# Patient Record
Sex: Female | Born: 1990 | Race: Black or African American | Hispanic: No | Marital: Single | Smoking: Current every day smoker
Health system: Southern US, Community
[De-identification: ages and names within clinical notes are randomized; demographics above are authoritative.]

## PROBLEM LIST (undated history)

## (undated) ENCOUNTER — Inpatient Hospital Stay (HOSPITAL_COMMUNITY): Payer: Self-pay

## (undated) DIAGNOSIS — R0600 Dyspnea, unspecified: Secondary | ICD-10-CM

## (undated) DIAGNOSIS — F419 Anxiety disorder, unspecified: Secondary | ICD-10-CM

## (undated) DIAGNOSIS — F32A Depression, unspecified: Secondary | ICD-10-CM

## (undated) DIAGNOSIS — Z789 Other specified health status: Secondary | ICD-10-CM

## (undated) HISTORY — DX: Depression, unspecified: F32.A

## (undated) HISTORY — DX: Dyspnea, unspecified: R06.00

## (undated) HISTORY — PX: NO PAST SURGERIES: SHX2092

## (undated) HISTORY — DX: Anxiety disorder, unspecified: F41.9

---

## 2017-02-06 ENCOUNTER — Encounter (HOSPITAL_COMMUNITY): Payer: Self-pay | Admitting: *Deleted

## 2017-02-06 ENCOUNTER — Ambulatory Visit (HOSPITAL_COMMUNITY)
Admission: EM | Admit: 2017-02-06 | Discharge: 2017-02-06 | Disposition: A | Discharge status: Home / Self Care | Payer: Medicaid Other | Attending: Family Medicine | Admitting: Family Medicine

## 2017-02-06 DIAGNOSIS — R109 Unspecified abdominal pain: Secondary | ICD-10-CM | POA: Diagnosis not present

## 2017-02-06 DIAGNOSIS — Z3A13 13 weeks gestation of pregnancy: Secondary | ICD-10-CM

## 2017-02-06 DIAGNOSIS — Z3201 Encounter for pregnancy test, result positive: Secondary | ICD-10-CM | POA: Diagnosis not present

## 2017-02-06 DIAGNOSIS — R112 Nausea with vomiting, unspecified: Secondary | ICD-10-CM

## 2017-02-06 LAB — POCT PREGNANCY, URINE: Preg Test, Ur: POSITIVE — AB

## 2017-02-06 LAB — POCT URINALYSIS DIP (DEVICE)
GLUCOSE, UA: NEGATIVE mg/dL
HGB URINE DIPSTICK: NEGATIVE
Ketones, ur: >=160 mg/dL — AB
NITRITE: NEGATIVE
PROTEIN: 30 mg/dL — AB
UROBILINOGEN UA: 0.2 mg/dL (ref 0.0–1.0)
pH: 6 (ref 5.0–8.0)

## 2017-02-06 MED ORDER — ONDANSETRON 4 MG PO TBDP
4.0000 mg | ORAL_TABLET | Freq: Once | ORAL | Status: AC
  Administered 2017-02-06: 4 mg via ORAL

## 2017-02-06 MED ORDER — ONDANSETRON 4 MG PO TBDP
ORAL_TABLET | ORAL | Status: AC
  Filled 2017-02-06: qty 1

## 2017-02-06 NOTE — ED Provider Notes (Signed)
CSN: [id number]     Arrival date & time 02/06/17  1332 History   None    Chief Complaint  Patient presents with  . Abdominal Pain  . Weakness   (Consider location/radiation/quality/duration/timing/severity/associated sxs/prior Treatment) 26 year old female presents to clinic with a 24-hour history of lower abdominal pain, nausea, vomiting, and chills. She has had no diarrhea, unsure about fever, no chest pain, or shortness of breath. She has no dysuria, however states she has not had a period since early February. She denies any vaginal discharge, or any spotting. She is sexually active, does not use birth control. States there is a possibility of pregnancy.   The history is provided by the patient.  Abdominal Pain  Associated symptoms: chills, nausea and vomiting   Associated symptoms: no constipation, no diarrhea, no dysuria, no vaginal bleeding and no vaginal discharge   Weakness  Primary symptoms include no dizziness. Associated symptoms include vomiting. Pertinent negatives include no headaches.    History reviewed. No pertinent past medical history. History reviewed. No pertinent surgical history. History reviewed. No pertinent family history. Social History  Substance Use Topics  . Smoking status: Never Smoker  . Smokeless tobacco: Never Used  . Alcohol use No   OB History    No data available     Review of Systems  Constitutional: Positive for chills.  HENT: Negative.   Respiratory: Negative.   Cardiovascular: Negative.   Gastrointestinal: Positive for abdominal pain, nausea and vomiting. Negative for constipation and diarrhea.  Genitourinary: Negative for dysuria, flank pain, pelvic pain, vaginal bleeding, vaginal discharge and vaginal pain.  Musculoskeletal: Negative.   Skin: Negative.   Neurological: Positive for weakness. Negative for dizziness and headaches.    Allergies  Patient has no known allergies.  Home Medications   Prior to Admission medications    Not on File   Meds Ordered and Administered this Visit   Medications  ondansetron (ZOFRAN-ODT) disintegrating tablet 4 mg (not administered)    BP (!) 115/52 (BP Location: Right Arm) Comment: rn notified  Pulse 81   Temp 98.4 F (36.9 C) (Oral)   Resp 14   LMP  (LMP Unknown)   SpO2 100%  No data found.   Physical Exam  Constitutional: She is oriented to person, place, and time. She appears well-developed and well-nourished. No distress.  HENT:  Head: Normocephalic and atraumatic.  Right Ear: External ear normal.  Left Ear: External ear normal.  Eyes: Conjunctivae are normal.  Neck: Normal range of motion.  Cardiovascular: Normal rate and regular rhythm.   Pulmonary/Chest: Effort normal and breath sounds normal.  Abdominal: Normal appearance and bowel sounds are normal. There is tenderness in the periumbilical area and suprapubic area. There is no rebound and no CVA tenderness.  Neurological: She is alert and oriented to person, place, and time.  Skin: Skin is warm and dry. Capillary refill takes less than 2 seconds. No rash noted. She is not diaphoretic. No erythema.  Psychiatric: She has a normal mood and affect. Her behavior is normal.  Nursing note and vitals reviewed.   Urgent Care Course     Procedures (including critical care time)  Labs Review Labs Reviewed  POCT URINALYSIS DIP (DEVICE) - Abnormal; Notable for the following:       Result Value   Bilirubin Urine SMALL (*)    Ketones, ur >=160 (*)    Protein, ur 30 (*)    Leukocytes, UA TRACE (*)    All other components within normal limits  POCT PREGNANCY, URINE - Abnormal; Notable for the following:    Preg Test, Ur POSITIVE (*)    All other components within normal limits    Imaging Review No results found.     MDM   1. [redacted] weeks gestation of pregnancy    Positive pregnancy test, given Zofran in clinic for nausea and vomiting, discharged with instructions to go to the [facility] for  further evaluation of abdominal pain.   Dorena Bodo, NP 02/06/17 1527

## 2017-02-06 NOTE — Discharge Instructions (Signed)
You have a positive pregnancy test. With your symptoms of abdominal pain, I strongly recommend he go to the women's emergency room at [facility] for further evaluation of your abdominal pain.

## 2017-02-06 NOTE — ED Triage Notes (Signed)
Had   Sweaty     Episode last  Night   Lower  Back  Pain      With    vomiting  Last  Pm     Symptoms  Worse  Around  4  Am     denys  Any  Vaginal  Bleeding   Weakness

## 2017-02-13 ENCOUNTER — Encounter (HOSPITAL_COMMUNITY): Payer: Self-pay | Admitting: *Deleted

## 2017-02-13 ENCOUNTER — Inpatient Hospital Stay (HOSPITAL_COMMUNITY)
Admission: AD | Admit: 2017-02-13 | Discharge: 2017-02-13 | Disposition: A | Discharge status: Home / Self Care | Payer: Medicaid Other | Source: Ambulatory Visit | Attending: Obstetrics and Gynecology | Admitting: Obstetrics and Gynecology

## 2017-02-13 ENCOUNTER — Inpatient Hospital Stay (HOSPITAL_COMMUNITY): Payer: Medicaid Other

## 2017-02-13 DIAGNOSIS — Z331 Pregnant state, incidental: Secondary | ICD-10-CM

## 2017-02-13 DIAGNOSIS — Z3490 Encounter for supervision of normal pregnancy, unspecified, unspecified trimester: Secondary | ICD-10-CM

## 2017-02-13 DIAGNOSIS — Z3A09 9 weeks gestation of pregnancy: Secondary | ICD-10-CM | POA: Diagnosis not present

## 2017-02-13 DIAGNOSIS — N939 Abnormal uterine and vaginal bleeding, unspecified: Secondary | ICD-10-CM | POA: Diagnosis present

## 2017-02-13 DIAGNOSIS — O209 Hemorrhage in early pregnancy, unspecified: Secondary | ICD-10-CM | POA: Insufficient documentation

## 2017-02-13 DIAGNOSIS — O208 Other hemorrhage in early pregnancy: Secondary | ICD-10-CM

## 2017-02-13 LAB — CBC
HEMATOCRIT: 38.7 % (ref 36.0–46.0)
HEMOGLOBIN: 13.2 g/dL (ref 12.0–15.0)
MCH: 29.3 pg (ref 26.0–34.0)
MCHC: 34.1 g/dL (ref 30.0–36.0)
MCV: 86 fL (ref 78.0–100.0)
Platelets: 222 10*3/uL (ref 150–400)
RBC: 4.5 MIL/uL (ref 3.87–5.11)
RDW: 14.7 % (ref 11.5–15.5)
WBC: 7.7 10*3/uL (ref 4.0–10.5)

## 2017-02-13 LAB — URINALYSIS, ROUTINE W REFLEX MICROSCOPIC
BACTERIA UA: NONE SEEN
Bilirubin Urine: NEGATIVE
GLUCOSE, UA: NEGATIVE mg/dL
KETONES UR: NEGATIVE mg/dL
LEUKOCYTES UA: NEGATIVE
Nitrite: NEGATIVE
PH: 7 (ref 5.0–8.0)
Protein, ur: NEGATIVE mg/dL
RBC / HPF: NONE SEEN RBC/hpf (ref 0–5)
Specific Gravity, Urine: 1.008 (ref 1.005–1.030)

## 2017-02-13 LAB — HCG, QUANTITATIVE, PREGNANCY: HCG, BETA CHAIN, QUANT, S: 89984 m[IU]/mL — AB (ref <5)

## 2017-02-13 MED ORDER — PREPLUS 27-1 MG PO TABS: 1.0000 | ORAL_TABLET | Freq: Every day | ORAL | 13 refills | Status: DC

## 2017-02-13 NOTE — MAU Note (Signed)
Pt presents to MAU with complaints of vaginal bleeding that started today while pt was at work . Lower abdominal pain. Was evaluated at urgent care a week ago and was told to come here for evaluation

## 2017-02-13 NOTE — MAU Provider Note (Signed)
  History     CSN: [id number]  Arrival date and time: 02/13/17 1755   First Provider Initiated Contact with Patient 02/13/17 1901      Chief Complaint  Patient presents with  . Vaginal Bleeding   HPI  Gina Montoya is a 26 yo G2P1001 with an estimated due date of  08/02/17 based on LMP who presents to the maternity admissions unit for complaints of abnormal vaginal bleeding and abdominal cramps.  She presented last week to an urgent care for intermittent abdominal pain and had a positive pregnancy test, but has yet to schedule her first prenatal appointment.  She reports that the bleeding began today around 3:00PM, soaked through her underwear and pants, and has continued to bleed since.  She denies seeing any clots.    The abdominal pain improved after the urgent care appointment, but returned today when she began bleeding. She describes the abdominal pain as a constant tightness across the lower quadrants and suprapubic area.  She has taken nothing for the pain.  She has had one prior pregnancy that was a full-term, vaginal birth with no complications, and has been treated for chlamydia in the past.  She admits to nausea and fatigue, but denies vomiting, dizziness, or lightheadedness, or a hx of abdominal infections or PID, chronic medical problems or chronic medications.    OB History    Gravida Para Term Preterm AB Living   2 1 1     1    SAB TAB Ectopic Multiple Live Births                  History reviewed. No pertinent past medical history.  History reviewed. No pertinent surgical history.  History reviewed. No pertinent family history.  Social History  Substance Use Topics  . Smoking status: Never Smoker  . Smokeless tobacco: Never Used  . Alcohol use No    Allergies: No Known Allergies  No prescriptions prior to admission.    Review of Systems  Constitutional: Positive for fatigue. Negative for fever.  Gastrointestinal: Positive for abdominal pain and nausea.  Negative for vomiting.  Genitourinary: Positive for pelvic pain and vaginal bleeding. Negative for dysuria, vaginal discharge and vaginal pain.  Neurological: Positive for weakness. Negative for dizziness and light-headedness.   Physical Exam   Blood pressure 126/63, pulse 86, temperature 98.5 F (36.9 C), resp. rate 18, weight 52.6 kg (116 lb), last menstrual period 11/08/2016.  Physical Exam  Constitutional: She is oriented to person, place, and time. She appears well-developed and well-nourished.  Cardiovascular: Normal rate, regular rhythm and normal heart sounds.   Respiratory: Effort normal and breath sounds normal.  GI: Soft. Bowel sounds are normal. There is tenderness in the periumbilical area, suprapubic area, left upper quadrant and left lower quadrant.  Neurological: She is alert and oriented to person, place, and time.  Skin: Skin is warm and dry.  Psychiatric: She has a normal mood and affect. Her behavior is normal.    MAU Course  Procedures Labs and U/S IUP confirmed 9 2/7 weeks, + FHT's, subchroionic hemorrhage.   Assessment and Plan  IUP 9 2/7 weeks Subchroionic hemorrhage    Pt reassured. In[facility] List of [facility] providers given to pt.   Pt seen and examined with PA student.

## 2017-02-13 NOTE — Discharge Instructions (Signed)
First Trimester of Pregnancy The first trimester of pregnancy is from week 1 until the end of week 13 (months 1 through 3). A week after a sperm fertilizes an egg, the egg will implant on the wall of the uterus. This embryo will begin to develop into a baby. Genes from you and your partner will form the baby. The female genes will determine whether the baby will be a boy or a girl. At 6-8 weeks, the eyes and face will be formed, and the heartbeat can be seen on ultrasound. At the end of 12 weeks, all the baby's organs will be formed. Now that you are pregnant, you will want to do everything you can to have a healthy baby. Two of the most important things are to get good prenatal care and to follow your health care provider's instructions. Prenatal care is all the medical care you receive before the baby's birth. This care will help prevent, find, and treat any problems during the pregnancy and childbirth. Body changes during your first trimester Your body goes through many changes during pregnancy. The changes vary from woman to woman.  You may gain or lose a couple of pounds at first.  You may feel sick to your stomach (nauseous) and you may throw up (vomit). If the vomiting is uncontrollable, call your health care provider.  You may tire easily.  You may develop headaches that can be relieved by medicines. All medicines should be approved by your health care provider.  You may urinate more often. Painful urination may mean you have a bladder infection.  You may develop heartburn as a result of your pregnancy.  You may develop constipation because certain hormones are causing the muscles that push stool through your intestines to slow down.  You may develop hemorrhoids or swollen veins (varicose veins).  Your breasts may begin to grow larger and become tender. Your nipples may stick out more, and the tissue that surrounds them (areola) may become darker.  Your gums may bleed and may be  sensitive to brushing and flossing.  Dark spots or blotches (chloasma, mask of pregnancy) may develop on your face. This will likely fade after the baby is born.  Your menstrual periods will stop.  You may have a loss of appetite.  You may develop cravings for certain kinds of food.  You may have changes in your emotions from day to day, such as being excited to be pregnant or being concerned that something may go wrong with the pregnancy and baby.  You may have more vivid and strange dreams.  You may have changes in your hair. These can include thickening of your hair, rapid growth, and changes in texture. Some women also have hair loss during or after pregnancy, or hair that feels dry or thin. Your hair will most likely return to normal after your baby is born.  What to expect at prenatal visits During a routine prenatal visit:  You will be weighed to make sure you and the baby are growing normally.  Your blood pressure will be taken.  Your abdomen will be measured to track your baby's growth.  The fetal heartbeat will be listened to between weeks 10 and 14 of your pregnancy.  Test results from any previous visits will be discussed.  Your health care provider may ask you:  How you are feeling.  If you are feeling the baby move.  If you have had any abnormal symptoms, such as leaking fluid, bleeding, severe headaches,   or abdominal cramping.  If you are using any tobacco products, including cigarettes, chewing tobacco, and electronic cigarettes.  If you have any questions.  Other tests that may be performed during your first trimester include:  Blood tests to find your blood type and to check for the presence of any previous infections. The tests will also be used to check for low iron levels (anemia) and protein on red blood cells (Rh antibodies). Depending on your risk factors, or if you previously had diabetes during pregnancy, you may have tests to check for high blood  sugar that affects pregnant women (gestational diabetes).  Urine tests to check for infections, diabetes, or protein in the urine.  An ultrasound to confirm the proper growth and development of the baby.  Fetal screens for spinal cord problems (spina bifida) and Down syndrome.  HIV (human immunodeficiency virus) testing. Routine prenatal testing includes screening for HIV, unless you choose not to have this test.  You may need other tests to make sure you and the baby are doing well.  Follow these instructions at home: Medicines  Follow your health care provider's instructions regarding medicine use. Specific medicines may be either safe or unsafe to take during pregnancy.  Take a prenatal vitamin that contains at least 600 micrograms (mcg) of folic acid.  If you develop constipation, try taking a stool softener if your health care provider approves. Eating and drinking  Eat a balanced diet that includes fresh fruits and vegetables, whole grains, good sources of protein such as meat, eggs, or tofu, and low-fat dairy. Your health care provider will help you determine the amount of weight gain that is right for you.  Avoid raw meat and uncooked cheese. These carry germs that can cause birth defects in the baby.  Eating four or five small meals rather than three large meals a day may help relieve nausea and vomiting. If you start to feel nauseous, eating a few soda crackers can be helpful. Drinking liquids between meals, instead of during meals, also seems to help ease nausea and vomiting.  Limit foods that are high in fat and processed sugars, such as fried and sweet foods.  To prevent constipation: ? Eat foods that are high in fiber, such as fresh fruits and vegetables, whole grains, and beans. ? Drink enough fluid to keep your urine clear or pale yellow. Activity  Exercise only as directed by your health care provider. Most women can continue their usual exercise routine during  pregnancy. Try to exercise for 30 minutes at least 5 days a week. Exercising will help you: ? Control your weight. ? Stay in shape. ? Be prepared for labor and delivery.  Experiencing pain or cramping in the lower abdomen or lower back is a good sign that you should stop exercising. Check with your health care provider before continuing with normal exercises.  Try to avoid standing for long periods of time. Move your legs often if you must stand in one place for a long time.  Avoid heavy lifting.  Wear low-heeled shoes and practice good posture.  You may continue to have sex unless your health care provider tells you not to. Relieving pain and discomfort  Wear a good support bra to relieve breast tenderness.  Take warm sitz baths to soothe any pain or discomfort caused by hemorrhoids. Use hemorrhoid cream if your health care provider approves.  Rest with your legs elevated if you have leg cramps or low back pain.  If you develop   varicose veins in your legs, wear support hose. Elevate your feet for 15 minutes, 3-4 times a day. Limit salt in your diet. Prenatal care  Schedule your prenatal visits by the twelfth week of pregnancy. They are usually scheduled monthly at first, then more often in the last 2 months before delivery.  Write down your questions. Take them to your prenatal visits.  Keep all your prenatal visits as told by your health care provider. This is important. Safety  Wear your seat belt at all times when driving.  Make a list of emergency phone numbers, including numbers for family, friends, the hospital, and police and fire departments. General instructions  Ask your health care provider for a referral to a local prenatal education class. Begin classes no later than the beginning of month 6 of your pregnancy.  Ask for help if you have counseling or nutritional needs during pregnancy. Your health care provider can offer advice or refer you to specialists for help  with various needs.  Do not use hot tubs, steam rooms, or saunas.  Do not douche or use tampons or scented sanitary pads.  Do not cross your legs for long periods of time.  Avoid cat litter boxes and soil used by cats. These carry germs that can cause birth defects in the baby and possibly loss of the fetus by miscarriage or stillbirth.  Avoid all smoking, herbs, alcohol, and medicines not prescribed by your health care provider. Chemicals in these products affect the formation and growth of the baby.  Do not use any products that contain nicotine or tobacco, such as cigarettes and e-cigarettes. If you need help quitting, ask your health care provider. You may receive counseling support and other resources to help you quit.  Schedule a dentist appointment. At home, brush your teeth with a soft toothbrush and be gentle when you floss. Contact a health care provider if:  You have dizziness.  You have mild pelvic cramps, pelvic pressure, or nagging pain in the abdominal area.  You have persistent nausea, vomiting, or diarrhea.  You have a bad smelling vaginal discharge.  You have pain when you urinate.  You notice increased swelling in your face, hands, legs, or ankles.  You are exposed to fifth disease or chickenpox.  You are exposed to German measles (rubella) and have never had it. Get help right away if:  You have a fever.  You are leaking fluid from your vagina.  You have spotting or bleeding from your vagina.  You have severe abdominal cramping or pain.  You have rapid weight gain or loss.  You vomit blood or material that looks like coffee grounds.  You develop a severe headache.  You have shortness of breath.  You have any kind of trauma, such as from a fall or a car accident. Summary  The first trimester of pregnancy is from week 1 until the end of week 13 (months 1 through 3).  Your body goes through many changes during pregnancy. The changes vary from  woman to woman.  You will have routine prenatal visits. During those visits, your health care provider will examine you, discuss any test results you may have, and talk with you about how you are feeling. This information is not intended to replace advice given to you by your health care provider. Make sure you discuss any questions you have with your health care provider. Document Released: 08/22/2001 Document Revised: 08/09/2016 Document Reviewed: 08/09/2016 Elsevier Interactive Patient Education  2017 Elsevier   Inc.  

## 2017-02-14 LAB — ABO/RH: ABO/RH(D): AB POS

## 2017-04-12 ENCOUNTER — Encounter: Payer: Self-pay | Admitting: General Practice

## 2017-04-12 ENCOUNTER — Encounter: Payer: Medicaid Other | Admitting: Family Medicine

## 2017-04-12 ENCOUNTER — Encounter: Payer: Medicaid Other | Admitting: Obstetrics & Gynecology

## 2017-04-18 ENCOUNTER — Encounter: Payer: Self-pay | Admitting: Obstetrics and Gynecology

## 2017-04-18 ENCOUNTER — Encounter: Payer: Self-pay | Admitting: Family Medicine

## 2017-04-18 ENCOUNTER — Other Ambulatory Visit (HOSPITAL_COMMUNITY)
Admission: RE | Admit: 2017-04-18 | Discharge: 2017-04-18 | Disposition: A | Discharge status: Home / Self Care | Payer: Medicaid Other | Source: Ambulatory Visit | Attending: Obstetrics and Gynecology | Admitting: Obstetrics and Gynecology

## 2017-04-18 ENCOUNTER — Ambulatory Visit (INDEPENDENT_AMBULATORY_CARE_PROVIDER_SITE_OTHER): Payer: Medicaid Other | Admitting: Obstetrics and Gynecology

## 2017-04-18 VITALS — BP 107/58 | HR 74 | Ht 62.0 in

## 2017-04-18 DIAGNOSIS — Z3482 Encounter for supervision of other normal pregnancy, second trimester: Secondary | ICD-10-CM | POA: Diagnosis present

## 2017-04-18 DIAGNOSIS — O99345 Other mental disorders complicating the puerperium: Secondary | ICD-10-CM

## 2017-04-18 DIAGNOSIS — O0930 Supervision of pregnancy with insufficient antenatal care, unspecified trimester: Secondary | ICD-10-CM | POA: Insufficient documentation

## 2017-04-18 DIAGNOSIS — Z113 Encounter for screening for infections with a predominantly sexual mode of transmission: Secondary | ICD-10-CM

## 2017-04-18 DIAGNOSIS — Z331 Pregnant state, incidental: Secondary | ICD-10-CM

## 2017-04-18 DIAGNOSIS — F53 Postpartum depression: Secondary | ICD-10-CM | POA: Insufficient documentation

## 2017-04-18 DIAGNOSIS — Z3480 Encounter for supervision of other normal pregnancy, unspecified trimester: Secondary | ICD-10-CM | POA: Insufficient documentation

## 2017-04-18 DIAGNOSIS — O0932 Supervision of pregnancy with insufficient antenatal care, second trimester: Secondary | ICD-10-CM

## 2017-04-18 LAB — POCT URINALYSIS DIP (DEVICE)
BILIRUBIN URINE: NEGATIVE
GLUCOSE, UA: NEGATIVE mg/dL
Ketones, ur: NEGATIVE mg/dL
NITRITE: NEGATIVE
PROTEIN: NEGATIVE mg/dL
Specific Gravity, Urine: 1.02 (ref 1.005–1.030)
Urobilinogen, UA: 0.2 mg/dL (ref 0.0–1.0)
pH: 6 (ref 5.0–8.0)

## 2017-04-18 NOTE — Progress Notes (Signed)
     Subjective:   Gina Montoya is a 26 y.o. G2P1001 at 5637w3d by early ultrasound being seen today for her first obstetrical visit.  Her obstetrical history is significant for Late to care. , postpartum depression; untreated. Patient undecided about breast feeding. Pregnancy history fully reviewed. Unplanned pregnancy, however excited. FOB involved, living together.  LMP uncertain.   Patient reports no complaints.  HISTORY: Obstetric History   G2   P1   T1   P0   A0   L1    SAB0   TAB0   Ectopic0   Multiple0   Live Births1     # Outcome Date GA Lbr Len/2nd Weight Sex Delivery Anes PTL Lv  2 Current           1 Term 02/02/16    F Vag-Spont EPI N LIV     History reviewed. No pertinent past medical history. History reviewed. No pertinent surgical history. History reviewed. No pertinent family history. Social History  Substance Use Topics  . Smoking status: Never Smoker  . Smokeless tobacco: Never Used  . Alcohol use No   No Known Allergies Current Outpatient Prescriptions on File Prior to Visit  Medication Sig Dispense Refill  . Prenatal Vit-Fe Fumarate-FA (PREPLUS) 27-1 MG TABS Take 1 tablet by mouth daily. 30 tablet 13   No current facility-administered medications on file prior to visit.      Exam   Vitals:   04/18/17 0954 04/18/17 0956  BP: (!) 107/58   Pulse: 74   Height:  5\' 2"  (1.575 m)   Fetal Heart Rate (bpm): 154  Uterus: Fundus just below umbilicus   System: General: well-developed, well-nourished female in no acute distress   Skin: normal coloration and turgor, no rashes   Neurologic: oriented, normal, negative, normal mood   Extremities: normal strength, tone, and muscle mass, ROM of all joints is normal   HEENT PERRLA, extraocular movement intact and sclera clear, anicteric   Mouth/Teeth mucous membranes moist, pharynx normal without lesions and dental hygiene good   Neck supple and no masses   Cardiovascular: regular rate and rhythm   Respiratory:  no respiratory distress, normal breath sounds   Abdomen: soft, non-tender; bowel sounds normal; no masses,  no organomegaly     Assessment:   Pregnancy: G2P1001 @ 1437w3d by first trimester US, LMP uncertain.   1. Encounter for supervision of other normal pregnancy in second trimester  - Hemoglobinopathy evaluation - Culture, OB Urine - Obstetric Panel, Including HIV - US MFM OB COMP + 14 WK; Future - GC/Chlamydia probe amp ([place]  2. Supervision of other normal pregnancy, antepartum - GC/Chlamydia probe amp ([place])not at [facility]  3. Late prenatal care  Plan:   Initial labs drawn. Continue prenatal vitamins. Genetic Screening discussed, Quad screen requested  Ultrasound discussed; fetal anatomic survey: ordered. Problem list reviewed and updated. The nature of [facility] with multiple MDs and other Advanced Practice Providers was explained to patient; also emphasized that residents, students are part of our team. Routine obstetric precautions reviewed. Return in about 4 weeks (around 05/16/2017).     Delories Mauri, Harolyn RutherfordJennifer I, NP [facility] for [facility], [facility]

## 2017-04-19 LAB — GC/CHLAMYDIA PROBE AMP (~~LOC~~) NOT AT ARMC
CHLAMYDIA, DNA PROBE: NEGATIVE
NEISSERIA GONORRHEA: NEGATIVE

## 2017-04-20 LAB — OBSTETRIC PANEL, INCLUDING HIV
Antibody Screen: NEGATIVE
BASOS ABS: 0 10*3/uL (ref 0.0–0.2)
Basos: 0 %
EOS (ABSOLUTE): 0 10*3/uL (ref 0.0–0.4)
EOS: 1 %
HEMATOCRIT: 40.5 % (ref 34.0–46.6)
HEMOGLOBIN: 13.5 g/dL (ref 11.1–15.9)
HEP B S AG: NEGATIVE
HIV SCREEN 4TH GENERATION: NONREACTIVE
Immature Grans (Abs): 0 10*3/uL (ref 0.0–0.1)
Immature Granulocytes: 0 %
Lymphocytes Absolute: 1.7 10*3/uL (ref 0.7–3.1)
Lymphs: 29 %
MCH: 30 pg (ref 26.6–33.0)
MCHC: 33.3 g/dL (ref 31.5–35.7)
MCV: 90 fL (ref 79–97)
MONOCYTES: 8 %
Monocytes Absolute: 0.5 10*3/uL (ref 0.1–0.9)
NEUTROS ABS: 3.7 10*3/uL (ref 1.4–7.0)
Neutrophils: 62 %
PLATELETS: 199 10*3/uL (ref 150–379)
RBC: 4.5 x10E6/uL (ref 3.77–5.28)
RDW: 14.8 % (ref 12.3–15.4)
RPR: NONREACTIVE
RUBELLA: 1.21 {index} (ref >0.99)
Rh Factor: POSITIVE
WBC: 6 10*3/uL (ref 3.4–10.8)

## 2017-04-20 LAB — HEMOGLOBINOPATHY EVALUATION
HGB C: 0 %
HGB S: 0 %
HGB VARIANT: 0 %
Hemoglobin A2 Quantitation: 2.7 % (ref 1.8–3.2)
Hemoglobin F Quantitation: 0 % (ref 0.0–2.0)
Hgb A: 97.3 % (ref 96.4–98.8)

## 2017-04-21 LAB — URINE CULTURE, OB REFLEX

## 2017-04-21 LAB — CULTURE, OB URINE

## 2017-05-02 ENCOUNTER — Ambulatory Visit (HOSPITAL_COMMUNITY)
Admission: RE | Admit: 2017-05-02 | Discharge: 2017-05-02 | Disposition: A | Discharge status: Home / Self Care | Payer: Medicaid Other | Source: Ambulatory Visit | Attending: Obstetrics and Gynecology | Admitting: Obstetrics and Gynecology

## 2017-05-02 DIAGNOSIS — Z3482 Encounter for supervision of other normal pregnancy, second trimester: Secondary | ICD-10-CM

## 2017-05-02 DIAGNOSIS — Z3A20 20 weeks gestation of pregnancy: Secondary | ICD-10-CM | POA: Insufficient documentation

## 2017-05-02 DIAGNOSIS — O358XX0 Maternal care for other (suspected) fetal abnormality and damage, not applicable or unspecified: Secondary | ICD-10-CM | POA: Insufficient documentation

## 2017-05-16 ENCOUNTER — Other Ambulatory Visit (HOSPITAL_COMMUNITY)
Admission: RE | Admit: 2017-05-16 | Discharge: 2017-05-16 | Disposition: A | Discharge status: Home / Self Care | Payer: Medicaid Other | Source: Ambulatory Visit | Attending: Advanced Practice Midwife | Admitting: Advanced Practice Midwife

## 2017-05-16 ENCOUNTER — Ambulatory Visit (INDEPENDENT_AMBULATORY_CARE_PROVIDER_SITE_OTHER): Payer: Medicaid Other | Admitting: Advanced Practice Midwife

## 2017-05-16 VITALS — BP 110/61 | HR 74 | Wt 119.0 lb

## 2017-05-16 DIAGNOSIS — Z3482 Encounter for supervision of other normal pregnancy, second trimester: Secondary | ICD-10-CM | POA: Diagnosis not present

## 2017-05-16 DIAGNOSIS — Z3A22 22 weeks gestation of pregnancy: Secondary | ICD-10-CM | POA: Diagnosis not present

## 2017-05-16 DIAGNOSIS — O26892 Other specified pregnancy related conditions, second trimester: Secondary | ICD-10-CM

## 2017-05-16 DIAGNOSIS — B373 Candidiasis of vulva and vagina: Secondary | ICD-10-CM

## 2017-05-16 DIAGNOSIS — N898 Other specified noninflammatory disorders of vagina: Secondary | ICD-10-CM

## 2017-05-16 DIAGNOSIS — B3731 Acute candidiasis of vulva and vagina: Secondary | ICD-10-CM

## 2017-05-16 MED ORDER — TERCONAZOLE 0.4 % VA CREA: 1.0000 | TOPICAL_CREAM | Freq: Every day | VAGINAL | 1 refills | Status: DC

## 2017-05-16 NOTE — Progress Notes (Signed)
   PRENATAL VISIT NOTE  Subjective:  Gina Montoya is a 26 y.o. G2P1001 at 9117w3d being seen today for ongoing prenatal care.  She is currently monitored for the following issues for this low-risk pregnancy and has Supervision of other normal pregnancy, antepartum; Post partum depression; and Late prenatal care on her problem list.  Patient reports vaginal irritation.  Contractions: Not present. Vag. Bleeding: None.  Movement: Present. Denies leaking of fluid.   The following portions of the patient's history were reviewed and updated as appropriate: allergies, current medications, past family history, past medical history, past social history, past surgical history and problem list. Problem list updated.  Objective:   Vitals:   05/16/17 1048  BP: 110/61  Pulse: 74  Weight: 119 lb (54 kg)    Fetal Status: Fetal Heart Rate (bpm): 150   Movement: Present     General:  Alert, oriented and cooperative. Patient is in no acute distress.  Skin: Skin is warm and dry. No rash noted.   Cardiovascular: Normal heart rate noted  Respiratory: Normal respiratory effort, no problems with respiration noted  Abdomen: Soft, gravid, appropriate for gestational age.  Pain/Pressure: Absent     Pelvic: Cervical exam deferred        Extremities: Normal range of motion.  Edema: None  Mental Status:  Normal mood and affect. Normal behavior. Normal judgment and thought content.   Assessment and Plan:  Pregnancy: G2P1001 at 6517w3d  1. Vaginal discharge during pregnancy in second trimester  - Cervicovaginal ancillary only  2. Encounter for supervision of other normal pregnancy in second trimester   3. Vaginal yeast infection --Will treat for clinical signs of yeast infection.  --Terazol 7  Preterm labor symptoms and general obstetric precautions including but not limited to vaginal bleeding, contractions, leaking of fluid and fetal movement were reviewed in detail with the patient. Please refer  to After Visit Summary for other counseling recommendations.  No Follow-up on file.   Sharen CounterLisa Leftwich-Kirby, CNM

## 2017-05-18 LAB — CERVICOVAGINAL ANCILLARY ONLY
BACTERIAL VAGINITIS: POSITIVE — AB
Candida vaginitis: POSITIVE — AB
Trichomonas: NEGATIVE

## 2017-06-13 ENCOUNTER — Encounter: Payer: Self-pay | Admitting: Advanced Practice Midwife

## 2017-06-13 ENCOUNTER — Ambulatory Visit (INDEPENDENT_AMBULATORY_CARE_PROVIDER_SITE_OTHER): Payer: Medicaid Other | Admitting: Advanced Practice Midwife

## 2017-06-13 VITALS — BP 104/58 | HR 73 | Wt 125.1 lb

## 2017-06-13 DIAGNOSIS — O358XX0 Maternal care for other (suspected) fetal abnormality and damage, not applicable or unspecified: Secondary | ICD-10-CM

## 2017-06-13 DIAGNOSIS — O35EXX0 Maternal care for other (suspected) fetal abnormality and damage, fetal genitourinary anomalies, not applicable or unspecified: Secondary | ICD-10-CM

## 2017-06-13 DIAGNOSIS — B373 Candidiasis of vulva and vagina: Secondary | ICD-10-CM

## 2017-06-13 DIAGNOSIS — Z3A32 32 weeks gestation of pregnancy: Secondary | ICD-10-CM

## 2017-06-13 DIAGNOSIS — Z3480 Encounter for supervision of other normal pregnancy, unspecified trimester: Secondary | ICD-10-CM

## 2017-06-13 DIAGNOSIS — Z3482 Encounter for supervision of other normal pregnancy, second trimester: Secondary | ICD-10-CM

## 2017-06-13 DIAGNOSIS — B3731 Acute candidiasis of vulva and vagina: Secondary | ICD-10-CM

## 2017-06-13 MED ORDER — TERCONAZOLE 0.4 % VA CREA: 1.0000 | TOPICAL_CREAM | Freq: Every day | VAGINAL | 1 refills | Status: DC

## 2017-06-13 NOTE — Progress Notes (Signed)
Patient feels she still has yeast infection, did not take all the terazol cream

## 2017-06-13 NOTE — Patient Instructions (Signed)
Pregnancy and Influenza Influenza, also called the flu, is an infection of the respiratory tract. If you are pregnant, you are more likely to catch the flu. You are also more likely to have a more serious case of the flu. This is because pregnancy lowers your body's ability to fight off infections (it weakens your immune system). It also puts additional stress on your heart and lungs, which makes you more likely to have complications. Having a bad case of the flu, especially with a high fever, can be dangerous for your developing baby. It can cause you to go into early labor. How do people get the flu? The flu is caused by the influenza virus. This virus is common every year in the fall and winter. It spreads when virus particles get passed from person to person. You can get the virus if you are near a sick person who is coughing or sneezing. You can also get the virus if you touch something that has the virus on it and then touch your face. How can I protect myself against the flu?  Get a flu shot. The best way to prevent the flu is to get a flu shot before flu season starts. The flu shot is not dangerous for your developing baby. It may even help protect your baby from the flu for up to 6 months after birth. The flu shot is one type of flu vaccine. Another type is a nasal spray vaccine. Do not get the nasal spray vaccine. It is not approved for pregnancy.  Do not come in close contact with sick people.  Do not share food, drinks, or utensils with other people.  Wash your hands often. Use hand sanitizer when soap and water are not available. What should I do if I have flu symptoms? If you have any flu symptoms, call your health care provider right away. Flu symptoms include:  Fever or chills.  Muscle aches.  Headache.  Sore throat.  Nasal congestion.  Cough.  Feeling tired.  Loss of appetite.  Vomiting.  Diarrhea.  You may be able to take an antiviral medicine to keep the flu  from becoming severe and to shorten how long it lasts. What should I do at home if I am diagnosed with the flu?  Do not take any medicine, including cold or flu medicine, unless directed by your health care provider.  If you take antiviral medicine, make sure you finish it even if you start to feel better.  Drink enough fluid to keep your urine clear or pale yellow.  Get plenty of rest. When would I seek immediate medical care if I have the flu?  You have trouble breathing.  You have chest pain.  You begin to have labor pains.  You have a high fever that does not go down after you take medicine.  You do not feel your baby move.  You have diarrhea or vomiting that will not go away. This information is not intended to replace advice given to you by your health care provider. Make sure you discuss any questions you have with your health care provider. Document Released: 06/30/2008 Document Revised: 02/03/2016 Document Reviewed: 07/25/2013 Elsevier Interactive Patient Education  2017 [employer] Inc[employer]  

## 2017-06-17 NOTE — Progress Notes (Signed)
   PRENATAL VISIT NOTE  Subjective:  Gina Montoya is a 26 y.o. G2P1001 at [redacted]w[redacted]d being seen today for ongoing prenatal care.  She is currently monitored for the following issues for this low-risk pregnancy and has Supervision of other normal pregnancy, antepartum; Post partum depression; and Late prenatal care on her problem list.  Patient reports vaginal ithcing and discharge. Did not fill Terazol Rx and can't find it. .  Contractions: Not present. Vag. Bleeding: None.  Movement: Present. Denies leaking of fluid.   The following portions of the patient's history were reviewed and updated as appropriate: allergies, current medications, past family history, past medical history, past social history, past surgical history and problem list. Problem list updated.  Objective:   Vitals:   06/13/17 1050  BP: (!) 104/58  Pulse: 73  Weight: 125 lb 1.6 oz (56.7 kg)    Fetal Status: Fetal Heart Rate (bpm): 150 Fundal Height: 27 cm Movement: Present     General:  Alert, oriented and cooperative. Patient is in no acute distress.  Skin: Skin is warm and dry. No rash noted.   Cardiovascular: Normal heart rate noted  Respiratory: Normal respiratory effort, no problems with respiration noted  Abdomen: Soft, gravid, appropriate for gestational age.  Pain/Pressure: Present     Pelvic: Cervical exam deferred        Extremities: Normal range of motion.  Edema: None  Mental Status:  Normal mood and affect. Normal behavior. Normal judgment and thought content.   Assessment and Plan:  Pregnancy: G2P1001 at [redacted]w[redacted]d  1. Encounter for repeat ultrasound of fetal pyelectasis, antepartum, single or unspecified fetus  - [place] [facility] FOLLOW UP; Future  2. [redacted] weeks gestation of pregnancy  - [place] [facility] FOLLOW UP; Future  3. Vaginal yeast infection  - terconazole (TERAZOL 7) 0.4 % vaginal cream; Place 1 applicator vaginally at bedtime.  Dispense: 45 g; Refill: 1  4. Supervision of other normal pregnancy,  antepartum  5. Hx PPD - Wants to see Asher Muir at [facility]--ordered   Preterm labor symptoms and general obstetric precautions including but not limited to vaginal bleeding, contractions, leaking of fluid and fetal movement were reviewed in detail with the patient. Please refer to After Visit Summary for other counseling recommendations.  Return in about 2 weeks (around 06/27/2017) for ROB/GTT.   Dorathy Kinsman, CNM

## 2017-06-28 ENCOUNTER — Ambulatory Visit (INDEPENDENT_AMBULATORY_CARE_PROVIDER_SITE_OTHER): Payer: Medicaid Other | Admitting: Obstetrics & Gynecology

## 2017-06-28 VITALS — BP 109/65 | HR 83 | Wt 128.0 lb

## 2017-06-28 DIAGNOSIS — Z3483 Encounter for supervision of other normal pregnancy, third trimester: Secondary | ICD-10-CM

## 2017-06-28 DIAGNOSIS — Z3480 Encounter for supervision of other normal pregnancy, unspecified trimester: Secondary | ICD-10-CM

## 2017-06-28 NOTE — Progress Notes (Signed)
   PRENATAL VISIT NOTE  Subjective:  Gina Montoya is a 26 y.o. G2P1001 at 525w4d being seen today for ongoing prenatal care.  She is currently monitored for the following issues for this low-risk pregnancy and has Supervision of other normal pregnancy, antepartum; Post partum depression; and Late prenatal care on her problem list.  Patient reports no complaints.  Contractions: Not present. Vag. Bleeding: None.  Movement: Present. Denies leaking of fluid.   The following portions of the patient's history were reviewed and updated as appropriate: allergies, current medications, past family history, past medical history, past social history, past surgical history and problem list. Problem list updated.  Objective:   Vitals:   06/28/17 0809  BP: 109/65  Pulse: 83  Weight: 128 lb (58.1 kg)    Fetal Status: Fetal Heart Rate (bpm): 156   Movement: Present     General:  Alert, oriented and cooperative. Patient is in no acute distress.  Skin: Skin is warm and dry. No rash noted.   Cardiovascular: Normal heart rate noted  Respiratory: Normal respiratory effort, no problems with respiration noted  Abdomen: Soft, gravid, appropriate for gestational age.  Pain/Pressure: Present     Pelvic: Cervical exam deferred        Extremities: Normal range of motion.  Edema: None  Mental Status:  Normal mood and affect. Normal behavior. Normal judgment and thought content.   Assessment and Plan:  Pregnancy: G2P1001 at 815w4d  There are no diagnoses linked to this encounter. Preterm labor symptoms and general obstetric precautions including but not limited to vaginal bleeding, contractions, leaking of fluid and fetal movement were reviewed in detail with the patient. Please refer to After Visit Summary for other counseling recommendations.  Return in about 2 weeks (around 07/12/2017).   Scheryl DarterJames Kostas Marrow, MD

## 2017-06-28 NOTE — Progress Notes (Signed)
Pt stated having cramping sensation part of the belly button lasted for 2 hour.

## 2017-06-28 NOTE — Patient Instructions (Signed)
Third Trimester of Pregnancy The third trimester is from week 28 through week 40 (months 7 through 9). The third trimester is a time when the unborn baby (fetus) is growing rapidly. At the end of the ninth month, the fetus is about 20 inches in length and weighs 6-10 pounds. Body changes during your third trimester Your body will continue to go through many changes during pregnancy. The changes vary from woman to woman. During the third trimester:  Your weight will continue to increase. You can expect to gain 25-35 pounds (11-16 kg) by the end of the pregnancy.  You may begin to get stretch marks on your hips, abdomen, and breasts.  You may urinate more often because the fetus is moving lower into your pelvis and pressing on your bladder.  You may develop or continue to have heartburn. This is caused by increased hormones that slow down muscles in the digestive tract.  You may develop or continue to have constipation because increased hormones slow digestion and cause the muscles that push waste through your intestines to relax.  You may develop hemorrhoids. These are swollen veins (varicose veins) in the rectum that can itch or be painful.  You may develop swollen, bulging veins (varicose veins) in your legs.  You may have increased body aches in the pelvis, back, or thighs. This is due to weight gain and increased hormones that are relaxing your joints.  You may have changes in your hair. These can include thickening of your hair, rapid growth, and changes in texture. Some women also have hair loss during or after pregnancy, or hair that feels dry or thin. Your hair will most likely return to normal after your baby is born.  Your breasts will continue to grow and they will continue to become tender. A yellow fluid (colostrum) may leak from your breasts. This is the first milk you are producing for your baby.  Your belly button may stick out.  You may notice more swelling in your hands,  face, or ankles.  You may have increased tingling or numbness in your hands, arms, and legs. The skin on your belly may also feel numb.  You may feel short of breath because of your expanding uterus.  You may have more problems sleeping. This can be caused by the size of your belly, increased need to urinate, and an increase in your body's metabolism.  You may notice the fetus "dropping," or moving lower in your abdomen (lightening).  You may have increased vaginal discharge.  You may notice your joints feel loose and you may have pain around your pelvic bone.  What to expect at prenatal visits You will have prenatal exams every 2 weeks until week 36. Then you will have weekly prenatal exams. During a routine prenatal visit:  You will be weighed to make sure you and the baby are growing normally.  Your blood pressure will be taken.  Your abdomen will be measured to track your baby's growth.  The fetal heartbeat will be listened to.  Any test results from the previous visit will be discussed.  You may have a cervical check near your due date to see if your cervix has softened or thinned (effaced).  You will be tested for Group B streptococcus. This happens between 35 and 37 weeks.  Your health care provider may ask you:  What your birth plan is.  How you are feeling.  If you are feeling the baby move.  If you have had   any abnormal symptoms, such as leaking fluid, bleeding, severe headaches, or abdominal cramping.  If you are using any tobacco products, including cigarettes, chewing tobacco, and electronic cigarettes.  If you have any questions.  Other tests or screenings that may be performed during your third trimester include:  Blood tests that check for low iron levels (anemia).  Fetal testing to check the health, activity level, and growth of the fetus. Testing is done if you have certain medical conditions or if there are problems during the  pregnancy.  Nonstress test (NST). This test checks the health of your baby to make sure there are no signs of problems, such as the baby not getting enough oxygen. During this test, a belt is placed around your belly. The baby is made to move, and its heart rate is monitored during movement.  What is false labor? False labor is a condition in which you feel small, irregular tightenings of the muscles in the womb (contractions) that usually go away with rest, changing position, or drinking water. These are called Braxton Hicks contractions. Contractions may last for hours, days, or even weeks before true labor sets in. If contractions come at regular intervals, become more frequent, increase in intensity, or become painful, you should see your health care provider. What are the signs of labor?  Abdominal cramps.  Regular contractions that start at 10 minutes apart and become stronger and more frequent with time.  Contractions that start on the top of the uterus and spread down to the lower abdomen and back.  Increased pelvic pressure and dull back pain.  A watery or bloody mucus discharge that comes from the vagina.  Leaking of amniotic fluid. This is also known as your "water breaking." It could be a slow trickle or a gush. Let your health care provider know if it has a color or strange odor. If you have any of these signs, call your health care provider right away, even if it is before your due date. Follow these instructions at home: Medicines  Follow your health care provider's instructions regarding medicine use. Specific medicines may be either safe or unsafe to take during pregnancy.  Take a prenatal vitamin that contains at least 600 micrograms (mcg) of folic acid.  If you develop constipation, try taking a stool softener if your health care provider approves. Eating and drinking  Eat a balanced diet that includes fresh fruits and vegetables, whole grains, good sources of protein  such as meat, eggs, or tofu, and low-fat dairy. Your health care provider will help you determine the amount of weight gain that is right for you.  Avoid raw meat and uncooked cheese. These carry germs that can cause birth defects in the baby.  If you have low calcium intake from food, talk to your health care provider about whether you should take a daily calcium supplement.  Eat four or five small meals rather than three large meals a day.  Limit foods that are high in fat and processed sugars, such as fried and sweet foods.  To prevent constipation: ? Drink enough fluid to keep your urine clear or pale yellow. ? Eat foods that are high in fiber, such as fresh fruits and vegetables, whole grains, and beans. Activity  Exercise only as directed by your health care provider. Most women can continue their usual exercise routine during pregnancy. Try to exercise for 30 minutes at least 5 days a week. Stop exercising if you experience uterine contractions.  Avoid heavy   lifting.  Do not exercise in extreme heat or humidity, or at high altitudes.  Wear low-heel, comfortable shoes.  Practice good posture.  You may continue to have sex unless your health care provider tells you otherwise. Relieving pain and discomfort  Take frequent breaks and rest with your legs elevated if you have leg cramps or low back pain.  Take warm sitz baths to soothe any pain or discomfort caused by hemorrhoids. Use hemorrhoid cream if your health care provider approves.  Wear a good support bra to prevent discomfort from breast tenderness.  If you develop varicose veins: ? Wear support pantyhose or compression stockings as told by your healthcare provider. ? Elevate your feet for 15 minutes, 3-4 times a day. Prenatal care  Write down your questions. Take them to your prenatal visits.  Keep all your prenatal visits as told by your health care provider. This is important. Safety  Wear your seat belt at  all times when driving.  Make a list of emergency phone numbers, including numbers for family, friends, the hospital, and police and fire departments. General instructions  Avoid cat litter boxes and soil used by cats. These carry germs that can cause birth defects in the baby. If you have a cat, ask someone to clean the litter box for you.  Do not travel far distances unless it is absolutely necessary and only with the approval of your health care provider.  Do not use hot tubs, steam rooms, or saunas.  Do not drink alcohol.  Do not use any products that contain nicotine or tobacco, such as cigarettes and e-cigarettes. If you need help quitting, ask your health care provider.  Do not use any medicinal herbs or unprescribed drugs. These chemicals affect the formation and growth of the baby.  Do not douche or use tampons or scented sanitary pads.  Do not cross your legs for long periods of time.  To prepare for the arrival of your baby: ? Take prenatal classes to understand, practice, and ask questions about labor and delivery. ? Make a trial run to the hospital. ? Visit the hospital and tour the maternity area. ? Arrange for maternity or paternity leave through employers. ? Arrange for family and friends to take care of pets while you are in the hospital. ? Purchase a rear-facing car seat and make sure you know how to install it in your car. ? Pack your hospital bag. ? Prepare the baby's nursery. Make sure to remove all pillows and stuffed animals from the baby's crib to prevent suffocation.  Visit your dentist if you have not gone during your pregnancy. Use a soft toothbrush to brush your teeth and be gentle when you floss. Contact a health care provider if:  You are unsure if you are in labor or if your water has broken.  You become dizzy.  You have mild pelvic cramps, pelvic pressure, or nagging pain in your abdominal area.  You have lower back pain.  You have persistent  nausea, vomiting, or diarrhea.  You have an unusual or bad smelling vaginal discharge.  You have pain when you urinate. Get help right away if:  Your water breaks before 37 weeks.  You have regular contractions less than 5 minutes apart before 37 weeks.  You have a fever.  You are leaking fluid from your vagina.  You have spotting or bleeding from your vagina.  You have severe abdominal pain or cramping.  You have rapid weight loss or weight gain.    You have shortness of breath with chest pain.  You notice sudden or extreme swelling of your face, hands, ankles, feet, or legs.  Your baby makes fewer than 10 movements in 2 hours.  You have severe headaches that do not go away when you take medicine.  You have vision changes. Summary  The third trimester is from week 28 through week 40, months 7 through 9. The third trimester is a time when the unborn baby (fetus) is growing rapidly.  During the third trimester, your discomfort may increase as you and your baby continue to gain weight. You may have abdominal, leg, and back pain, sleeping problems, and an increased need to urinate.  During the third trimester your breasts will keep growing and they will continue to become tender. A yellow fluid (colostrum) may leak from your breasts. This is the first milk you are producing for your baby.  False labor is a condition in which you feel small, irregular tightenings of the muscles in the womb (contractions) that eventually go away. These are called Braxton Hicks contractions. Contractions may last for hours, days, or even weeks before true labor sets in.  Signs of labor can include: abdominal cramps; regular contractions that start at 10 minutes apart and become stronger and more frequent with time; watery or bloody mucus discharge that comes from the vagina; increased pelvic pressure and dull back pain; and leaking of amniotic fluid. This information is not intended to replace advice  given to you by your health care provider. Make sure you discuss any questions you have with your health care provider. Document Released: 08/22/2001 Document Revised: 02/03/2016 Document Reviewed: 10/29/2012 Elsevier Interactive Patient Education  2017 [employer]  

## 2017-06-29 LAB — CBC
Hematocrit: 39.5 % (ref 34.0–46.6)
Hemoglobin: 13.3 g/dL (ref 11.1–15.9)
MCH: 31 pg (ref 26.6–33.0)
MCHC: 33.7 g/dL (ref 31.5–35.7)
MCV: 92 fL (ref 79–97)
PLATELETS: 160 10*3/uL (ref 150–379)
RBC: 4.29 x10E6/uL (ref 3.77–5.28)
RDW: 14.8 % (ref 12.3–15.4)
WBC: 7.6 10*3/uL (ref 3.4–10.8)

## 2017-06-29 LAB — GLUCOSE TOLERANCE, 2 HOURS W/ 1HR
GLUCOSE, 2 HOUR: 82 mg/dL (ref 65–152)
GLUCOSE, FASTING: 72 mg/dL (ref 65–91)
Glucose, 1 hour: 95 mg/dL (ref 65–179)

## 2017-06-29 LAB — HIV ANTIBODY (ROUTINE TESTING W REFLEX): HIV SCREEN 4TH GENERATION: NONREACTIVE

## 2017-06-29 LAB — RPR: RPR Ser Ql: NONREACTIVE

## 2017-07-13 ENCOUNTER — Ambulatory Visit (INDEPENDENT_AMBULATORY_CARE_PROVIDER_SITE_OTHER): Payer: Medicaid Other | Admitting: Obstetrics and Gynecology

## 2017-07-13 DIAGNOSIS — O0990 Supervision of high risk pregnancy, unspecified, unspecified trimester: Secondary | ICD-10-CM | POA: Insufficient documentation

## 2017-07-13 DIAGNOSIS — O0993 Supervision of high risk pregnancy, unspecified, third trimester: Secondary | ICD-10-CM

## 2017-07-13 NOTE — Progress Notes (Signed)
Subjective:  Gina Montoya is a 26 y.o. G2P1001 at 502w5d being seen today for ongoing prenatal care.  She is currently monitored for the following issues for this high-risk pregnancy and has Post partum depression; Late prenatal care; and Pregnancy, supervision, high-risk on her problem list.  Patient reports no complaints.  Contractions: Not present. Vag. Bleeding: None.  Movement: Present. Denies leaking of fluid.   The following portions of the patient's history were reviewed and updated as appropriate: allergies, current medications, past family history, past medical history, past social history, past surgical history and problem list. Problem list updated.  Objective:   Vitals:   07/13/17 0949  BP: 111/71  Pulse: 79  Weight: 57.8 kg (127 lb 6.4 oz)    Fetal Status: Fetal Heart Rate (bpm): 147   Movement: Present     General:  Alert, oriented and cooperative. Patient is in no acute distress.  Skin: Skin is warm and dry. No rash noted.   Cardiovascular: Normal heart rate noted  Respiratory: Normal respiratory effort, no problems with respiration noted  Abdomen: Soft, gravid, appropriate for gestational age. Pain/Pressure: Absent     Pelvic:  Cervical exam deferred        Extremities: Normal range of motion.  Edema: None  Mental Status: Normal mood and affect. Normal behavior. Normal judgment and thought content.   Urinalysis:      Assessment and Plan:  Pregnancy: G2P1001 at 742w5d  1. Supervision of high risk pregnancy in third trimester Stable Desires Tdap and Flu vaccine next visit Discussed Panorama with pt in relationship to UTD. Will think about it and consider for next visit Pap 2017 after last child normal per pt. Will sign for release of medical information to obatin  UTD, follow up U/S scheduled  Preterm labor symptoms and general obstetric precautions including but not limited to vaginal bleeding, contractions, leaking of fluid and fetal movement were  reviewed in detail with the patient. Please refer to After Visit Summary for other counseling recommendations.  Return in about 2 weeks (around 07/27/2017) for OB visit.   Hermina StaggersErvin, Breindel Collier L, MD

## 2017-07-26 ENCOUNTER — Other Ambulatory Visit: Payer: Self-pay | Admitting: Advanced Practice Midwife

## 2017-07-26 ENCOUNTER — Ambulatory Visit (HOSPITAL_COMMUNITY)
Admission: RE | Admit: 2017-07-26 | Discharge: 2017-07-26 | Disposition: A | Discharge status: Home / Self Care | Payer: Medicaid Other | Source: Ambulatory Visit | Attending: Advanced Practice Midwife | Admitting: Advanced Practice Midwife

## 2017-07-26 ENCOUNTER — Other Ambulatory Visit (HOSPITAL_COMMUNITY): Payer: Self-pay | Admitting: *Deleted

## 2017-07-26 DIAGNOSIS — O35EXX0 Maternal care for other (suspected) fetal abnormality and damage, fetal genitourinary anomalies, not applicable or unspecified: Secondary | ICD-10-CM

## 2017-07-26 DIAGNOSIS — Z362 Encounter for other antenatal screening follow-up: Secondary | ICD-10-CM | POA: Insufficient documentation

## 2017-07-26 DIAGNOSIS — Z3A32 32 weeks gestation of pregnancy: Secondary | ICD-10-CM | POA: Insufficient documentation

## 2017-07-26 DIAGNOSIS — O358XX0 Maternal care for other (suspected) fetal abnormality and damage, not applicable or unspecified: Secondary | ICD-10-CM

## 2017-07-26 DIAGNOSIS — O359XX0 Maternal care for (suspected) fetal abnormality and damage, unspecified, not applicable or unspecified: Secondary | ICD-10-CM | POA: Diagnosis not present

## 2017-07-27 ENCOUNTER — Encounter: Payer: Self-pay | Admitting: Medical

## 2017-07-27 ENCOUNTER — Ambulatory Visit (INDEPENDENT_AMBULATORY_CARE_PROVIDER_SITE_OTHER): Payer: Medicaid Other | Admitting: Medical

## 2017-07-27 DIAGNOSIS — O0993 Supervision of high risk pregnancy, unspecified, third trimester: Secondary | ICD-10-CM

## 2017-07-27 DIAGNOSIS — Z23 Encounter for immunization: Secondary | ICD-10-CM | POA: Diagnosis not present

## 2017-07-27 NOTE — Patient Instructions (Signed)
Fetal Movement Counts °Patient Name: ________________________________________________ Patient Due Date: ____________________ °What is a fetal movement count? °A fetal movement count is the number of times that you feel your baby move during a certain amount of time. This may also be called a fetal kick count. A fetal movement count is recommended for every pregnant woman. You may be asked to start counting fetal movements as early as week 28 of your pregnancy. °Pay attention to when your baby is most active. You may notice your baby's sleep and wake cycles. You may also notice things that make your baby move more. You should do a fetal movement count: °· When your baby is normally most active. °· At the same time each day. ° °A good time to count movements is while you are resting, after having something to eat and drink. °How do I count fetal movements? °1. Find a quiet, comfortable area. Sit, or lie down on your side. °2. Write down the date, the start time and stop time, and the number of movements that you felt between those two times. Take this information with you to your health care visits. °3. For 2 hours, count kicks, flutters, swishes, rolls, and jabs. You should feel at least 10 movements during 2 hours. °4. You may stop counting after you have felt 10 movements. °5. If you do not feel 10 movements in 2 hours, have something to eat and drink. Then, keep resting and counting for 1 hour. If you feel at least 4 movements during that hour, you may stop counting. °Contact a health care provider if: °· You feel fewer than 4 movements in 2 hours. °· Your baby is not moving like he or she usually does. °Date: ____________ Start time: ____________ Stop time: ____________ Movements: ____________ °Date: ____________ Start time: ____________ Stop time: ____________ Movements: ____________ °Date: ____________ Start time: ____________ Stop time: ____________ Movements: ____________ °Date: ____________ Start time:  ____________ Stop time: ____________ Movements: ____________ °Date: ____________ Start time: ____________ Stop time: ____________ Movements: ____________ °Date: ____________ Start time: ____________ Stop time: ____________ Movements: ____________ °Date: ____________ Start time: ____________ Stop time: ____________ Movements: ____________ °Date: ____________ Start time: ____________ Stop time: ____________ Movements: ____________ °Date: ____________ Start time: ____________ Stop time: ____________ Movements: ____________ °This information is not intended to replace advice given to you by your health care provider. Make sure you discuss any questions you have with your health care provider. °Document Released: 09/27/2006 Document Revised: 04/26/2016 Document Reviewed: 10/07/2015 °Elsevier Interactive Patient Education © 2018 [employer] °Braxton Hicks Contractions °Contractions of the uterus can occur throughout pregnancy, but they are not always a sign that you are in labor. You may have practice contractions called Braxton Hicks contractions. These false labor contractions are sometimes confused with true labor. °What are Braxton Hicks contractions? °Braxton Hicks contractions are tightening movements that occur in the muscles of the uterus before labor. Unlike true labor contractions, these contractions do not result in opening (dilation) and thinning of the cervix. Toward the end of pregnancy (32-34 weeks), Braxton Hicks contractions can happen more often and may become stronger. These contractions are sometimes difficult to tell apart from true labor because they can be very uncomfortable. You should not feel embarrassed if you go to the hospital with false labor. °Sometimes, the only way to tell if you are in true labor is for your health care provider to look for changes in the cervix. The health care provider will do a physical exam and may monitor your contractions. If   you are not in true labor, the exam  should show that your cervix is not dilating and your water has not broken. °If there are no prenatal problems or other health problems associated with your pregnancy, it is completely safe for you to be sent home with false labor. You may continue to have Braxton Hicks contractions until you go into true labor. °How can I tell the difference between true labor and false labor? °· Differences °? False labor °? Contractions last 30-70 seconds.: Contractions are usually shorter and not as strong as true labor contractions. °? Contractions become very regular.: Contractions are usually irregular. °? Discomfort is usually felt in the top of the uterus, and it spreads to the lower abdomen and low back.: Contractions are often felt in the front of the lower abdomen and in the groin. °? Contractions do not go away with walking.: Contractions may go away when you walk around or change positions while lying down. °? Contractions usually become more intense and increase in frequency.: Contractions get weaker and are shorter-lasting as time goes on. °? The cervix dilates and gets thinner.: The cervix usually does not dilate or become thin. °Follow these instructions at home: °· Take over-the-counter and prescription medicines only as told by your health care provider. °· Keep up with your usual exercises and follow other instructions from your health care provider. °· Eat and drink lightly if you think you are going into labor. °· If Braxton Hicks contractions are making you uncomfortable: °? Change your position from lying down or resting to walking, or change from walking to resting. °? Sit and rest in a tub of warm water. °? Drink enough fluid to keep your urine clear or pale yellow. Dehydration may cause these contractions. °? Do slow and deep breathing several times an hour. °· Keep all follow-up prenatal visits as told by your health care provider. This is important. °Contact a health care provider if: °· You have a  fever. °· You have continuous pain in your abdomen. °Get help right away if: °· Your contractions become stronger, more regular, and closer together. °· You have fluid leaking or gushing from your vagina. °· You pass blood-tinged mucus (bloody show). °· You have bleeding from your vagina. °· You have low back pain that you never had before. °· You feel your baby’s head pushing down and causing pelvic pressure. °· Your baby is not moving inside you as much as it used to. °Summary °· Contractions that occur before labor are called Braxton Hicks contractions, false labor, or practice contractions. °· Braxton Hicks contractions are usually shorter, weaker, farther apart, and less regular than true labor contractions. True labor contractions usually become progressively stronger and regular and they become more frequent. °· Manage discomfort from Braxton Hicks contractions by changing position, resting in a warm bath, drinking plenty of water, or practicing deep breathing. °This information is not intended to replace advice given to you by your health care provider. Make sure you discuss any questions you have with your health care provider. °Document Released: 08/28/2005 Document Revised: 07/17/2016 Document Reviewed: 07/17/2016 °Elsevier Interactive Patient Education © 2017 [employer] ° °

## 2017-07-27 NOTE — Progress Notes (Signed)
   PRENATAL VISIT NOTE  Subjective:  Gina Montoya is a 26 y.o. G2P1001 at [redacted]w[redacted]d being seen today for ongoing prenatal care.  She is currently monitored for the following issues for this low-risk pregnancy and has Post partum depression; Late prenatal care; and Pregnancy, supervision, high-risk on their problem list.  Patient reports fatigue.  Contractions: Not present. Vag. Bleeding: None.  Movement: Present. Denies leaking of fluid.   The following portions of the patient's history were reviewed and updated as appropriate: allergies, current medications, past family history, past medical history, past social history, past surgical history and problem list. Problem list updated.  Objective:   Vitals:   07/27/17 1038  BP: 117/65  Pulse: 84  Weight: 131 lb 12.8 oz (59.8 kg)    Fetal Status: Fetal Heart Rate (bpm): 145 Fundal Height: 30 cm Movement: Present     General:  Alert, oriented and cooperative. Patient is in no acute distress.  Skin: Skin is warm and dry. No rash noted.   Cardiovascular: Normal heart rate noted  Respiratory: Normal respiratory effort, no problems with respiration noted  Abdomen: Soft, gravid, appropriate for gestational age.  Pain/Pressure: Absent     Pelvic: Cervical exam deferred        Extremities: Normal range of motion.  Edema: None  Mental Status:  Normal mood and affect. Normal behavior. Normal judgment and thought content.   Assessment and Plan:  Pregnancy: G2P1001 at [redacted]w[redacted]d  1. Supervision of high risk pregnancy in third trimester - Doing well, easily fatigued - Requesting work restrictions, advised that we cannot dictate working hours, but can request frequent breaks and minor restrictions - note given   Preterm labor symptoms and general obstetric precautions including but not limited to vaginal bleeding, contractions, leaking of fluid and fetal movement were reviewed in detail with the patient. Please refer to After Visit Summary for  other counseling recommendations.  Return in about 2 weeks (around 08/10/2017) for LOB.   Vonzella NippleJulie Nashonda Limberg, PA-C

## 2017-07-30 ENCOUNTER — Encounter: Payer: Self-pay | Admitting: Advanced Practice Midwife

## 2017-07-30 DIAGNOSIS — O365990 Maternal care for other known or suspected poor fetal growth, unspecified trimester, not applicable or unspecified: Secondary | ICD-10-CM | POA: Insufficient documentation

## 2017-08-10 ENCOUNTER — Ambulatory Visit (INDEPENDENT_AMBULATORY_CARE_PROVIDER_SITE_OTHER): Payer: Medicaid Other | Admitting: Obstetrics & Gynecology

## 2017-08-10 VITALS — BP 112/78 | HR 92 | Wt 135.0 lb

## 2017-08-10 DIAGNOSIS — O0993 Supervision of high risk pregnancy, unspecified, third trimester: Secondary | ICD-10-CM

## 2017-08-10 NOTE — Progress Notes (Signed)
   PRENATAL VISIT NOTE  Subjective:  Gina Montoya is a 26 y.o. G2P1001 at 2160w5d being seen today for ongoing prenatal care.  She is currently monitored for the following issues for this low-risk pregnancy and has Post partum depression; Late prenatal care; Pregnancy, supervision, high-risk; and Small for gestational age fetus affecting management of mother on their problem list.  Patient reports no complaints.  Contractions: Not present. Vag. Bleeding: None.  Movement: Present. Denies leaking of fluid.   The following portions of the patient's history were reviewed and updated as appropriate: allergies, current medications, past family history, past medical history, past social history, past surgical history and problem list. Problem list updated.  Objective:   Vitals:   08/10/17 1119  BP: 112/78  Pulse: 92  Weight: 135 lb (61.2 kg)    Fetal Status: Fetal Heart Rate (bpm): 147   Movement: Present     General:  Alert, oriented and cooperative. Patient is in no acute distress.  Skin: Skin is warm and dry. No rash noted.   Cardiovascular: Normal heart rate noted  Respiratory: Normal respiratory effort, no problems with respiration noted  Abdomen: Soft, gravid, appropriate for gestational age.  Pain/Pressure: Absent     Pelvic: Cervical exam deferred        Extremities: Normal range of motion.  Edema: None  Mental Status:  Normal mood and affect. Normal behavior. Normal judgment and thought content.   Assessment and Plan:  Pregnancy: G2P1001 at 3860w5d  There are no diagnoses linked to this encounter. Preterm labor symptoms and general obstetric precautions including but not limited to vaginal bleeding, contractions, leaking of fluid and fetal movement were reviewed in detail with the patient. Please refer to After Visit Summary for other counseling recommendations.  No Follow-up on file.   Allie BossierMyra C Jaleeyah Munce, MD

## 2017-08-16 ENCOUNTER — Encounter (HOSPITAL_COMMUNITY): Payer: Self-pay

## 2017-08-16 ENCOUNTER — Other Ambulatory Visit (HOSPITAL_COMMUNITY): Payer: Self-pay | Admitting: *Deleted

## 2017-08-16 ENCOUNTER — Other Ambulatory Visit (HOSPITAL_COMMUNITY): Payer: Self-pay | Admitting: Maternal and Fetal Medicine

## 2017-08-16 ENCOUNTER — Ambulatory Visit (HOSPITAL_COMMUNITY)
Admission: RE | Admit: 2017-08-16 | Discharge: 2017-08-16 | Disposition: A | Discharge status: Home / Self Care | Payer: Medicaid Other | Source: Ambulatory Visit | Attending: Obstetrics & Gynecology | Admitting: Obstetrics & Gynecology

## 2017-08-16 DIAGNOSIS — Z362 Encounter for other antenatal screening follow-up: Secondary | ICD-10-CM

## 2017-08-16 DIAGNOSIS — O365930 Maternal care for other known or suspected poor fetal growth, third trimester, not applicable or unspecified: Secondary | ICD-10-CM | POA: Insufficient documentation

## 2017-08-16 DIAGNOSIS — Z3A35 35 weeks gestation of pregnancy: Secondary | ICD-10-CM

## 2017-08-16 HISTORY — DX: Other specified health status: Z78.9

## 2017-08-17 ENCOUNTER — Ambulatory Visit (INDEPENDENT_AMBULATORY_CARE_PROVIDER_SITE_OTHER): Payer: Medicaid Other | Admitting: Obstetrics & Gynecology

## 2017-08-17 ENCOUNTER — Ambulatory Visit (INDEPENDENT_AMBULATORY_CARE_PROVIDER_SITE_OTHER): Payer: Medicaid Other | Admitting: Clinical

## 2017-08-17 ENCOUNTER — Other Ambulatory Visit (HOSPITAL_COMMUNITY)
Admission: RE | Admit: 2017-08-17 | Discharge: 2017-08-17 | Disposition: A | Discharge status: Home / Self Care | Payer: Medicaid Other | Source: Ambulatory Visit | Attending: Obstetrics & Gynecology | Admitting: Obstetrics & Gynecology

## 2017-08-17 VITALS — BP 127/80 | HR 79 | Wt 134.9 lb

## 2017-08-17 DIAGNOSIS — F4322 Adjustment disorder with anxiety: Secondary | ICD-10-CM | POA: Diagnosis not present

## 2017-08-17 DIAGNOSIS — O365933 Maternal care for other known or suspected poor fetal growth, third trimester, fetus 3: Secondary | ICD-10-CM

## 2017-08-17 DIAGNOSIS — O365930 Maternal care for other known or suspected poor fetal growth, third trimester, not applicable or unspecified: Secondary | ICD-10-CM

## 2017-08-17 DIAGNOSIS — O0993 Supervision of high risk pregnancy, unspecified, third trimester: Secondary | ICD-10-CM

## 2017-08-17 NOTE — Progress Notes (Signed)
   PRENATAL VISIT NOTE  Subjective:  Gina Montoya is a 26 y.o. G2P1001 at 1737w5d being seen today for ongoing prenatal care.  She is currently monitored for the following issues for this low-risk pregnancy and has Post partum depression; Late prenatal care; Pregnancy, supervision, high-risk; and Small for gestational age fetus affecting management of mother on their problem list.  Patient reports no complaints.  Contractions: Regular. Vag. Bleeding: None.  Movement: Present. Denies leaking of fluid.   The following portions of the patient's history were reviewed and updated as appropriate: allergies, current medications, past family history, past medical history, past social history, past surgical history and problem list. Problem list updated.  Objective:   Vitals:   08/17/17 1054  BP: 127/80  Pulse: 79  Weight: 134 lb 14.4 oz (61.2 kg)    Fetal Status: Fetal Heart Rate (bpm): 139   Movement: Present     General:  Alert, oriented and cooperative. Patient is in no acute distress.  Skin: Skin is warm and dry. No rash noted.   Cardiovascular: Normal heart rate noted  Respiratory: Normal respiratory effort, no problems with respiration noted  Abdomen: Soft, gravid, appropriate for gestational age.  Pain/Pressure: Present     Pelvic: Cervical exam deferred        Extremities: Normal range of motion.  Edema: None  Mental Status:  Normal mood and affect. Normal behavior. Normal judgment and thought content.   Assessment and Plan:  Pregnancy: G2P1001 at 4837w5d  1. Poor fetal growth affecting management of mother in third trimester, single or unspecified fetus   2. Supervision of high risk pregnancy in third trimester  - Cervicovaginal ancillary only - Culture, beta strep (group b only)  Preterm labor symptoms and general obstetric precautions including but not limited to vaginal bleeding, contractions, leaking of fluid and fetal movement were reviewed in detail with the  patient. Please refer to After Visit Summary for other counseling recommendations.  Return in about 1 week (around 08/24/2017).   Allie BossierMyra C Najeh Credit, MD

## 2017-08-17 NOTE — BH Specialist Note (Signed)
Integrated Behavioral Health Initial Visit  MRN: [id number] Name: Gina Montoya  Number of Integrated Behavioral Health Clinician visits:: 1/6 Session Start time: 10:26Session End time: 10:50 Total time: 20 minutes  Type of Service: Integrated Behavioral Health- Individual/Family Interpretor:No. Interpretor Name and Language: n/a   Warm Hand Off Completed.       SUBJECTIVE: Gina Montoya is a 26 y.o. female accompanied by n/a Patient was referred by Dr Marice Potterove for anxiety. Patient reports the following symptoms/concerns: Pt states her primary concern today is lack of quality sleep, disturbing dreams, anxiety, worry, financial stress, and difficulty relaxing; pt open to learning self-coping strategy today. Pt also concerned about transportation, after losing keys to the family car. Duration of problem: Current pregnancy; Severity of problem: moderate  OBJECTIVE: Mood: Anxious and Affect: Appropriate Risk of harm to self or others: No plan to harm self or others  LIFE CONTEXT: Family and Social: Lives with 1yo daughter and FOB School/Work: Going on maternity leave soon; FOB started new job Self-Care: Recognizing need for greater self-care Life Changes: Current pregnancy; transportation issues  GOALS ADDRESSED: Patient will: 1. Reduce symptoms of: anxiety and stress 2. Increase knowledge and/or ability of: self-management skills and stress reduction  3. Demonstrate ability to: Increase healthy adjustment to current life circumstances  INTERVENTIONS: Interventions utilized: Mindfulness or Management consultantelaxation Training, Psychoeducation and/or Health Education and Link to [facility]  Standardized Assessments completed: GAD-7 and PHQ 9  ASSESSMENT: Patient currently experiencing Adjustment disorder with anxious mood.   Patient may benefit from psychotherapy and brief therapeutic intervention regarding coping with symptoms of anxiety, along with community  resources.  PLAN: 1. Follow up with behavioral health clinician on : One week for brief f/u 2. Behavioral recommendations:  -CALM relaxation breathing exercise every morning; as needed throughout the day -Consider transportation solutions, discussed in office(Medicaid transportation, [place] reduced-fare ID, [employer] membership) -Consider sleep sound and calming apps for additional self-coping -Read educational material regarding coping with symptoms of anxiety 3. Referral(s): Integrated Behavioral Health Services (In Clinic) 4. "From scale of 1-10, how likely are you to follow plan?": 9  Rae LipsJamie C McMannes, LCSW  Dep[facility] screen [facility] 07/27/2017 06/13/2017 04/18/2017  Decreased Interest 0 2 0  Down, Depressed, Hopeless 0 1 0  PHQ - 2 Score 0 3 0  Altered sleeping 1 0 0  Tired, decreased energy 0 1 1  Change in appetite 0 0 0  Feeling bad or failure about yourself  0 0 0  Trouble concentrating 0 0 0  Moving slowly or fidgety/restless 1 0 0  Suicidal thoughts 0 0 0  PHQ-9 Score 2 4 1    GAD 7 : Generalized Anxiety Score 07/27/2017 06/13/2017 04/18/2017  Nervous, Anxious, on Edge 1 1 0  Control/stop worrying 1 1 0  Worry too much - different things 1 1 0  Trouble relaxing 1 0 0  Restless 1 0 0  Easily annoyed or irritable 1 1 0  Afraid - awful might happen 0 0 0  Total GAD 7 Score 6 4 0

## 2017-08-18 LAB — CERVICOVAGINAL ANCILLARY ONLY
Chlamydia: NEGATIVE
NEISSERIA GONORRHEA: NEGATIVE

## 2017-08-20 LAB — CULTURE, BETA STREP (GROUP B ONLY): STREP GP B CULTURE: NEGATIVE

## 2017-08-24 ENCOUNTER — Ambulatory Visit (HOSPITAL_COMMUNITY)
Admission: RE | Admit: 2017-08-24 | Discharge: 2017-08-24 | Disposition: A | Discharge status: Home / Self Care | Payer: Medicaid Other | Source: Ambulatory Visit | Attending: Obstetrics & Gynecology | Admitting: Obstetrics & Gynecology

## 2017-08-24 ENCOUNTER — Encounter: Payer: Self-pay | Admitting: Medical

## 2017-08-24 ENCOUNTER — Encounter (HOSPITAL_COMMUNITY): Payer: Self-pay

## 2017-08-24 ENCOUNTER — Ambulatory Visit (INDEPENDENT_AMBULATORY_CARE_PROVIDER_SITE_OTHER): Payer: Medicaid Other | Admitting: Medical

## 2017-08-24 VITALS — BP 113/72 | HR 90 | Wt 134.5 lb

## 2017-08-24 DIAGNOSIS — O0993 Supervision of high risk pregnancy, unspecified, third trimester: Secondary | ICD-10-CM

## 2017-08-24 DIAGNOSIS — O365930 Maternal care for other known or suspected poor fetal growth, third trimester, not applicable or unspecified: Secondary | ICD-10-CM | POA: Diagnosis not present

## 2017-08-24 DIAGNOSIS — Z3A36 36 weeks gestation of pregnancy: Secondary | ICD-10-CM | POA: Insufficient documentation

## 2017-08-24 DIAGNOSIS — O365990 Maternal care for other known or suspected poor fetal growth, unspecified trimester, not applicable or unspecified: Secondary | ICD-10-CM | POA: Diagnosis present

## 2017-08-24 NOTE — Addendum Note (Signed)
Encounter addended by: Melynda KellerVics, Shown Dissinger R, RDMS on: 08/24/2017 10:25 AM  Actions taken: Imaging Exam ended

## 2017-08-24 NOTE — Patient Instructions (Addendum)
Fetal Movement Counts Patient Name: ________________________________________________ Patient Due Date: ____________________ What is a fetal movement count? A fetal movement count is the number of times that you feel your baby move during a certain amount of time. This may also be called a fetal kick count. A fetal movement count is recommended for every pregnant woman. You may be asked to start counting fetal movements as early as week 28 of your pregnancy. Pay attention to when your baby is most active. You may notice your baby's sleep and wake cycles. You may also notice things that make your baby move more. You should do a fetal movement count:  When your baby is normally most active.  At the same time each day.  A good time to count movements is while you are resting, after having something to eat and drink. How do I count fetal movements? 1. Find a quiet, comfortable area. Sit, or lie down on your side. 2. Write down the date, the start time and stop time, and the number of movements that you felt between those two times. Take this information with you to your health care visits. 3. For 2 hours, count kicks, flutters, swishes, rolls, and jabs. You should feel at least 10 movements during 2 hours. 4. You may stop counting after you have felt 10 movements. 5. If you do not feel 10 movements in 2 hours, have something to eat and drink. Then, keep resting and counting for 1 hour. If you feel at least 4 movements during that hour, you may stop counting. Contact a health care provider if:  You feel fewer than 4 movements in 2 hours.  Your baby is not moving like he or she usually does. Date: ____________ Start time: ____________ Stop time: ____________ Movements: ____________ Date: ____________ Start time: ____________ Stop time: ____________ Movements: ____________ Date: ____________ Start time: ____________ Stop time: ____________ Movements: ____________ Date: ____________ Start time:  ____________ Stop time: ____________ Movements: ____________ Date: ____________ Start time: ____________ Stop time: ____________ Movements: ____________ Date: ____________ Start time: ____________ Stop time: ____________ Movements: ____________ Date: ____________ Start time: ____________ Stop time: ____________ Movements: ____________ Date: ____________ Start time: ____________ Stop time: ____________ Movements: ____________ Date: ____________ Start time: ____________ Stop time: ____________ Movements: ____________ This information is not intended to replace advice given to you by your health care provider. Make sure you discuss any questions you have with your health care provider. Document Released: 09/27/2006 Document Revised: 04/26/2016 Document Reviewed: 10/07/2015 Elsevier Interactive Patient Education  2018 [employer]. [employer] of the uterus can occur throughout pregnancy, but they are not always a sign that you are in labor. You may have practice contractions called Braxton Hicks contractions. These false labor contractions are sometimes confused with true labor. What are [place] contractions? Braxton Hicks contractions are tightening movements that occur in the muscles of the uterus before labor. Unlike true labor contractions, these contractions do not result in opening (dilation) and thinning of the cervix. Toward the end of pregnancy (32-34 weeks), Braxton Hicks contractions can happen more often and may become stronger. These contractions are sometimes difficult to tell apart from true labor because they can be very uncomfortable. You should not feel embarrassed if you go to the hospital with false labor. Sometimes, the only way to tell if you are in true labor is for your health care provider to look for changes in the cervix. The health care provider will do a physical exam and may monitor your contractions. If  you are not in true labor, the exam  should show that your cervix is not dilating and your water has not broken. If there are no prenatal problems or other health problems associated with your pregnancy, it is completely safe for you to be sent home with false labor. You may continue to have Braxton Hicks contractions until you go into true labor. How can I tell the difference between true labor and false labor?  Differences ? False labor ? Contractions last 30-70 seconds.: Contractions are usually shorter and not as strong as true labor contractions. ? Contractions become very regular.: Contractions are usually irregular. ? Discomfort is usually felt in the top of the uterus, and it spreads to the lower abdomen and low back.: Contractions are often felt in the front of the lower abdomen and in the groin. ? Contractions do not go away with walking.: Contractions may go away when you walk around or change positions while lying down. ? Contractions usually become more intense and increase in frequency.: Contractions get weaker and are shorter-lasting as time goes on. ? The cervix dilates and gets thinner.: The cervix usually does not dilate or become thin. Follow these instructions at home:  Take over-the-counter and prescription medicines only as told by your health care provider.  Keep up with your usual exercises and follow other instructions from your health care provider.  Eat and drink lightly if you think you are going into labor.  If Braxton Hicks contractions are making you uncomfortable: ? Change your position from lying down or resting to walking, or change from walking to resting. ? Sit and rest in a tub of warm water. ? Drink enough fluid to keep your urine clear or pale yellow. Dehydration may cause these contractions. ? Do slow and deep breathing several times an hour.  Keep all follow-up prenatal visits as told by your health care provider. This is important. Contact a health care provider if:  You have a  fever.  You have continuous pain in your abdomen. Get help right away if:  Your contractions become stronger, more regular, and closer together.  You have fluid leaking or gushing from your vagina.  You pass blood-tinged mucus (bloody show).  You have bleeding from your vagina.  You have low back pain that you never had before.  You feel your baby's head pushing down and causing pelvic pressure.  Your baby is not moving inside you as much as it used to. Summary  Contractions that occur before labor are called Braxton Hicks contractions, false labor, or practice contractions.  Braxton Hicks contractions are usually shorter, weaker, farther apart, and less regular than true labor contractions. True labor contractions usually become progressively stronger and regular and they become more frequent.  Manage discomfort from [facility] LLC[facility]xton Hicks contractions by changing position, resting in a warm bath, drinking plenty of water, or practicing deep breathing. This information is not intended to replace advice given to you by your health care provider. Make sure you discuss any questions you have with your health care provider. Document Released: 08/28/2005 Document Revised: 07/17/2016 Document Reviewed: 07/17/2016 Elsevier Interactive Patient Education  2017 [employer]  Places to have your son circumcised:    [facility] [phone] while you are in hospital  [facility] [phone] $244 by 4 wks  [facility] [phone] $175 by 2 wks  [facility] [phone] $250 by 7 days [facility] [phone] $150 by 4 wks  These prices sometimes change but are roughly what you can expect  to pay. Please call and confirm pricing.   Circumcision is considered an elective/non-medically necessary procedure. There are many reasons parents  decide to have their sons circumsized. During the first year of life circumcised males have a reduced risk of urinary tract infections but after this year the rates between circumcised males and uncircumcised males are the same.  It is safe to have your son circumcised outside of the hospital and the places above perform them regularly.

## 2017-08-24 NOTE — Procedures (Signed)
Gina GalloKhasha Montoya Apr 30, 1991 7523w5d  Fetus A Non-Stress Test Interpretation for 08/24/17  Indication: Unsatisfactory BPP  Fetal Heart Rate A Mode: External Baseline Rate (A): 130 bpm Variability: Moderate Accelerations: 15 x 15, 10 x 10 Decelerations: None Multiple birth?: No  Uterine Activity Mode: Palpation, Toco Contraction Frequency (min): 5-8 Contraction Duration (sec): 50-60 Contraction Quality: Mild Resting Tone Palpated: Relaxed Resting Time: Adequate  Interpretation (Fetal Testing) Nonstress Test Interpretation: Reactive Comments: Reviewed tracing with Dr. Ezzard StandingNewman

## 2017-08-24 NOTE — Progress Notes (Signed)
   PRENATAL VISIT NOTE  Subjective:  Gina Montoya is a 26 y.o. G2P1001 at 1349w5d being seen today for ongoing prenatal care.  She is currently monitored for the following issues for this high-risk pregnancy and has Post partum depression; Late prenatal care; Pregnancy, supervision, high-risk; and Small for gestational age fetus affecting management of mother on their problem list.  Patient reports no complaints.  Contractions: Not present. Vag. Bleeding: None.  Movement: Present. Denies leaking of fluid.   The following portions of the patient's history were reviewed and updated as appropriate: allergies, current medications, past family history, past medical history, past social history, past surgical history and problem list. Problem list updated.  Objective:   Vitals:   08/24/17 1126  BP: 113/72  Pulse: 90  Weight: 134 lb 8 oz (61 kg)    Fetal Status: Fetal Heart Rate (bpm): 140 Fundal Height: 33 cm Movement: Present     General:  Alert, oriented and cooperative. Patient is in no acute distress.  Skin: Skin is warm and dry. No rash noted.   Cardiovascular: Normal heart rate noted  Respiratory: Normal respiratory effort, no problems with respiration noted  Abdomen: Soft, gravid, appropriate for gestational age.  Pain/Pressure: Absent     Pelvic: Cervical exam deferred        Extremities: Normal range of motion.  Edema: None  Mental Status:  Normal mood and affect. Normal behavior. Normal judgment and thought content.   Assessment and Plan:  Pregnancy: G2P1001 at 2249w5d  1. Supervision of high risk pregnancy in third trimester - Doing well - Requesting outpatient circ information, included on AVS  2. Poor fetal growth affecting management of mother in third trimester, single or unspecified fetus - Has weekly NST/BPP with MFM  Preterm labor symptoms and general obstetric precautions including but not limited to vaginal bleeding, contractions, leaking of fluid and fetal  movement were reviewed in detail with the patient. Please refer to After Visit Summary for other counseling recommendations.  Return in about 1 week (around 08/31/2017) for LOB.   Vonzella NippleJulie Wenzel, PA-C

## 2017-08-28 ENCOUNTER — Inpatient Hospital Stay (HOSPITAL_COMMUNITY): Payer: Medicaid Other | Admitting: Anesthesiology

## 2017-08-28 ENCOUNTER — Encounter (HOSPITAL_COMMUNITY): Payer: Self-pay | Admitting: *Deleted

## 2017-08-28 ENCOUNTER — Encounter (HOSPITAL_COMMUNITY)
Admission: AD | Disposition: A | Discharge status: Home / Self Care | Payer: Self-pay | Source: Ambulatory Visit | Attending: Family Medicine

## 2017-08-28 ENCOUNTER — Encounter (HOSPITAL_COMMUNITY): Payer: Self-pay

## 2017-08-28 ENCOUNTER — Inpatient Hospital Stay (HOSPITAL_COMMUNITY)
Admission: AD | Admit: 2017-08-28 | Discharge: 2017-08-30 | DRG: 788 | Disposition: A | Discharge status: Home / Self Care | Payer: Medicaid Other | Source: Ambulatory Visit | Attending: Family Medicine | Admitting: Family Medicine

## 2017-08-28 ENCOUNTER — Other Ambulatory Visit: Payer: Self-pay

## 2017-08-28 DIAGNOSIS — Z3A37 37 weeks gestation of pregnancy: Secondary | ICD-10-CM | POA: Diagnosis not present

## 2017-08-28 DIAGNOSIS — O365930 Maternal care for other known or suspected poor fetal growth, third trimester, not applicable or unspecified: Principal | ICD-10-CM | POA: Diagnosis present

## 2017-08-28 DIAGNOSIS — O99345 Other mental disorders complicating the puerperium: Secondary | ICD-10-CM

## 2017-08-28 DIAGNOSIS — O328XX0 Maternal care for other malpresentation of fetus, not applicable or unspecified: Secondary | ICD-10-CM | POA: Diagnosis present

## 2017-08-28 DIAGNOSIS — F53 Postpartum depression: Secondary | ICD-10-CM

## 2017-08-28 LAB — URINALYSIS, ROUTINE W REFLEX MICROSCOPIC
Bilirubin Urine: NEGATIVE
GLUCOSE, UA: NEGATIVE mg/dL
HGB URINE DIPSTICK: NEGATIVE
Ketones, ur: NEGATIVE mg/dL
NITRITE: NEGATIVE
PH: 8 (ref 5.0–8.0)
PROTEIN: NEGATIVE mg/dL
SPECIFIC GRAVITY, URINE: 1.01 (ref 1.005–1.030)

## 2017-08-28 LAB — CBC
HEMATOCRIT: 49.1 % — AB (ref 36.0–46.0)
HEMOGLOBIN: 17 g/dL — AB (ref 12.0–15.0)
MCH: 32.1 pg (ref 26.0–34.0)
MCHC: 34.6 g/dL (ref 30.0–36.0)
MCV: 92.6 fL (ref 78.0–100.0)
Platelets: 176 10*3/uL (ref 150–400)
RBC: 5.3 MIL/uL — ABNORMAL HIGH (ref 3.87–5.11)
RDW: 14.1 % (ref 11.5–15.5)
WBC: 8.4 10*3/uL (ref 4.0–10.5)

## 2017-08-28 LAB — RPR: RPR Ser Ql: NONREACTIVE

## 2017-08-28 LAB — TYPE AND SCREEN
ABO/RH(D): AB POS
Antibody Screen: NEGATIVE

## 2017-08-28 SURGERY — Surgical Case
Anesthesia: Epidural | Wound class: Clean Contaminated

## 2017-08-28 MED ORDER — EPHEDRINE 5 MG/ML INJ: 10.0000 mg | INTRAVENOUS | Status: DC | PRN

## 2017-08-28 MED ORDER — LIDOCAINE HCL (PF) 1 % IJ SOLN
INTRAMUSCULAR | Status: DC | PRN
  Administered 2017-08-28 (×2): 5 mL via EPIDURAL

## 2017-08-28 MED ORDER — DEXTROSE 5 % IV SOLN
500.0000 mg | Freq: Once | INTRAVENOUS | Status: AC
  Administered 2017-08-28: 500 mg via INTRAVENOUS
  Filled 2017-08-28: qty 500

## 2017-08-28 MED ORDER — SODIUM BICARBONATE 8.4 % IV SOLN
INTRAVENOUS | Status: DC | PRN
  Administered 2017-08-28: 2 mL via EPIDURAL
  Administered 2017-08-28: 4 mL via EPIDURAL
  Administered 2017-08-28: 5 mL via EPIDURAL
  Administered 2017-08-28: 2 mL via EPIDURAL
  Administered 2017-08-28: 3 mL via EPIDURAL

## 2017-08-28 MED ORDER — NALBUPHINE HCL 10 MG/ML IJ SOLN
5.0000 mg | INTRAMUSCULAR | Status: DC | PRN
Start: 2017-08-28 — End: 2017-08-30

## 2017-08-28 MED ORDER — OXYTOCIN BOLUS FROM INFUSION: 500.0000 mL | Freq: Once | INTRAVENOUS | Status: DC

## 2017-08-28 MED ORDER — SODIUM CHLORIDE 0.9% FLUSH: 3.0000 mL | INTRAVENOUS | Status: DC | PRN

## 2017-08-28 MED ORDER — KETOROLAC TROMETHAMINE 30 MG/ML IJ SOLN: 30.0000 mg | Freq: Four times a day (QID) | INTRAMUSCULAR | Status: AC | PRN

## 2017-08-28 MED ORDER — KETOROLAC TROMETHAMINE 30 MG/ML IJ SOLN
INTRAMUSCULAR | Status: AC
  Filled 2017-08-28: qty 1

## 2017-08-28 MED ORDER — TETANUS-DIPHTH-ACELL PERTUSSIS 5-2.5-18.5 LF-MCG/0.5 IM SUSP: 0.5000 mL | Freq: Once | INTRAMUSCULAR | Status: DC

## 2017-08-28 MED ORDER — LACTATED RINGERS IV SOLN
500.0000 mL | Freq: Once | INTRAVENOUS | Status: AC
  Administered 2017-08-28: 1000 mL via INTRAVENOUS

## 2017-08-28 MED ORDER — PHENYLEPHRINE 40 MCG/ML (10ML) SYRINGE FOR IV PUSH (FOR BLOOD PRESSURE SUPPORT): 80.0000 ug | PREFILLED_SYRINGE | INTRAVENOUS | Status: DC | PRN

## 2017-08-28 MED ORDER — LIDOCAINE HCL (PF) 1 % IJ SOLN
30.0000 mL | INTRAMUSCULAR | Status: DC | PRN
  Filled 2017-08-28: qty 30

## 2017-08-28 MED ORDER — PRENATAL MULTIVITAMIN CH
1.0000 | ORAL_TABLET | Freq: Every day | ORAL | Status: DC
  Administered 2017-08-29: 1 via ORAL
  Filled 2017-08-28: qty 1

## 2017-08-28 MED ORDER — SIMETHICONE 80 MG PO CHEW
80.0000 mg | CHEWABLE_TABLET | Freq: Three times a day (TID) | ORAL | Status: DC
  Administered 2017-08-29 – 2017-08-30 (×4): 80 mg via ORAL
  Filled 2017-08-28 (×5): qty 1

## 2017-08-28 MED ORDER — ONDANSETRON HCL 4 MG/2ML IJ SOLN: 4.0000 mg | Freq: Once | INTRAMUSCULAR | Status: DC | PRN

## 2017-08-28 MED ORDER — NALBUPHINE HCL 10 MG/ML IJ SOLN: 5.0000 mg | Freq: Once | INTRAMUSCULAR | Status: DC | PRN

## 2017-08-28 MED ORDER — OXYCODONE HCL 5 MG PO TABS
10.0000 mg | ORAL_TABLET | ORAL | Status: DC | PRN
Start: 2017-08-28 — End: 2017-08-30
  Administered 2017-08-29: 10 mg via ORAL
  Filled 2017-08-28: qty 2

## 2017-08-28 MED ORDER — OXYCODONE HCL 5 MG PO TABS
5.0000 mg | ORAL_TABLET | ORAL | Status: DC | PRN
  Administered 2017-08-29 – 2017-08-30 (×3): 5 mg via ORAL
  Filled 2017-08-28 (×3): qty 1

## 2017-08-28 MED ORDER — LACTATED RINGERS IV SOLN
INTRAVENOUS | Status: DC
  Administered 2017-08-28 – 2017-08-29 (×2): via INTRAVENOUS

## 2017-08-28 MED ORDER — PHENYLEPHRINE 8 MG IN D5W 100 ML (0.08MG/ML) PREMIX OPTIME
INJECTION | INTRAVENOUS | Status: AC
  Filled 2017-08-28: qty 100

## 2017-08-28 MED ORDER — ONDANSETRON HCL 4 MG/2ML IJ SOLN: 4.0000 mg | Freq: Three times a day (TID) | INTRAMUSCULAR | Status: DC | PRN

## 2017-08-28 MED ORDER — DIPHENHYDRAMINE HCL 50 MG/ML IJ SOLN: 12.5000 mg | INTRAMUSCULAR | Status: DC | PRN

## 2017-08-28 MED ORDER — SIMETHICONE 80 MG PO CHEW: 80.0000 mg | CHEWABLE_TABLET | ORAL | Status: DC | PRN

## 2017-08-28 MED ORDER — LACTATED RINGERS IV SOLN
INTRAVENOUS | Status: DC | PRN
  Administered 2017-08-28: 40 [IU] via INTRAVENOUS

## 2017-08-28 MED ORDER — FENTANYL CITRATE (PF) 100 MCG/2ML IJ SOLN
50.0000 ug | INTRAMUSCULAR | Status: DC | PRN
  Filled 2017-08-28: qty 2

## 2017-08-28 MED ORDER — IBUPROFEN 600 MG PO TABS
600.0000 mg | ORAL_TABLET | Freq: Four times a day (QID) | ORAL | Status: DC
  Administered 2017-08-28 – 2017-08-30 (×7): 600 mg via ORAL
  Filled 2017-08-28 (×5): qty 1

## 2017-08-28 MED ORDER — COCONUT OIL OIL: 1.0000 "application " | TOPICAL_OIL | Status: DC | PRN

## 2017-08-28 MED ORDER — LACTATED RINGERS IV SOLN
INTRAVENOUS | Status: DC
  Administered 2017-08-28: 09:00:00 via INTRAVENOUS

## 2017-08-28 MED ORDER — NALOXONE HCL 0.4 MG/ML IJ SOLN: 0.4000 mg | INTRAMUSCULAR | Status: DC | PRN

## 2017-08-28 MED ORDER — NALOXONE HCL 0.4 MG/ML IJ SOLN
1.0000 ug/kg/h | INTRAMUSCULAR | Status: DC | PRN
  Filled 2017-08-28: qty 5

## 2017-08-28 MED ORDER — ACETAMINOPHEN 325 MG PO TABS: 650.0000 mg | ORAL_TABLET | ORAL | Status: DC | PRN

## 2017-08-28 MED ORDER — SCOPOLAMINE 1 MG/3DAYS TD PT72: 1.0000 | MEDICATED_PATCH | Freq: Once | TRANSDERMAL | Status: DC

## 2017-08-28 MED ORDER — FENTANYL CITRATE (PF) 100 MCG/2ML IJ SOLN: 25.0000 ug | INTRAMUSCULAR | Status: DC | PRN

## 2017-08-28 MED ORDER — ONDANSETRON HCL 4 MG/2ML IJ SOLN
INTRAMUSCULAR | Status: AC
  Filled 2017-08-28: qty 2

## 2017-08-28 MED ORDER — MORPHINE SULFATE (PF) 0.5 MG/ML IJ SOLN
INTRAMUSCULAR | Status: DC | PRN
  Administered 2017-08-28: 3 mg via EPIDURAL

## 2017-08-28 MED ORDER — FENTANYL 2.5 MCG/ML BUPIVACAINE 1/10 % EPIDURAL INFUSION (WH - ANES)
14.0000 mL/h | INTRAMUSCULAR | Status: DC | PRN
  Administered 2017-08-28: 14 mL/h via EPIDURAL

## 2017-08-28 MED ORDER — MEPERIDINE HCL 25 MG/ML IJ SOLN: 6.2500 mg | INTRAMUSCULAR | Status: DC | PRN

## 2017-08-28 MED ORDER — PHENYLEPHRINE HCL 10 MG/ML IJ SOLN
INTRAMUSCULAR | Status: DC | PRN
  Administered 2017-08-28: 80 ug via INTRAVENOUS

## 2017-08-28 MED ORDER — ONDANSETRON HCL 4 MG/2ML IJ SOLN
INTRAMUSCULAR | Status: DC | PRN
  Administered 2017-08-28: 4 mg via INTRAVENOUS

## 2017-08-28 MED ORDER — CEFAZOLIN SODIUM-DEXTROSE 2-4 GM/100ML-% IV SOLN
2.0000 g | Freq: Three times a day (TID) | INTRAVENOUS | Status: DC
  Filled 2017-08-28 (×5): qty 100

## 2017-08-28 MED ORDER — SIMETHICONE 80 MG PO CHEW
80.0000 mg | CHEWABLE_TABLET | ORAL | Status: DC
  Administered 2017-08-28 – 2017-08-29 (×2): 80 mg via ORAL
  Filled 2017-08-28 (×2): qty 1

## 2017-08-28 MED ORDER — WITCH HAZEL-GLYCERIN EX PADS: 1.0000 "application " | MEDICATED_PAD | CUTANEOUS | Status: DC | PRN

## 2017-08-28 MED ORDER — ZOLPIDEM TARTRATE 5 MG PO TABS: 5.0000 mg | ORAL_TABLET | Freq: Every evening | ORAL | Status: DC | PRN

## 2017-08-28 MED ORDER — OXYCODONE-ACETAMINOPHEN 5-325 MG PO TABS: 1.0000 | ORAL_TABLET | ORAL | Status: DC | PRN

## 2017-08-28 MED ORDER — SOD CITRATE-CITRIC ACID 500-334 MG/5ML PO SOLN
30.0000 mL | ORAL | Status: DC | PRN
  Administered 2017-08-28: 30 mL via ORAL
  Filled 2017-08-28: qty 15

## 2017-08-28 MED ORDER — DIBUCAINE 1 % RE OINT: 1.0000 "application " | TOPICAL_OINTMENT | RECTAL | Status: DC | PRN

## 2017-08-28 MED ORDER — DIPHENHYDRAMINE HCL 25 MG PO CAPS: 25.0000 mg | ORAL_CAPSULE | Freq: Four times a day (QID) | ORAL | Status: DC | PRN

## 2017-08-28 MED ORDER — OXYTOCIN 40 UNITS IN LACTATED RINGERS INFUSION - SIMPLE MED
2.5000 [IU]/h | INTRAVENOUS | Status: DC
  Filled 2017-08-28: qty 1000

## 2017-08-28 MED ORDER — DIPHENHYDRAMINE HCL 25 MG PO CAPS: 25.0000 mg | ORAL_CAPSULE | ORAL | Status: DC | PRN

## 2017-08-28 MED ORDER — LACTATED RINGERS IV SOLN
INTRAVENOUS | Status: DC | PRN
  Administered 2017-08-28 (×2): via INTRAVENOUS

## 2017-08-28 MED ORDER — LACTATED RINGERS IV SOLN: 500.0000 mL | INTRAVENOUS | Status: DC | PRN

## 2017-08-28 MED ORDER — CEFAZOLIN SODIUM-DEXTROSE 2-3 GM-%(50ML) IV SOLR
INTRAVENOUS | Status: DC | PRN
  Administered 2017-08-28: 2 g via INTRAVENOUS

## 2017-08-28 MED ORDER — PHENYLEPHRINE 40 MCG/ML (10ML) SYRINGE FOR IV PUSH (FOR BLOOD PRESSURE SUPPORT)
PREFILLED_SYRINGE | INTRAVENOUS | Status: AC
  Filled 2017-08-28: qty 10

## 2017-08-28 MED ORDER — FENTANYL 2.5 MCG/ML BUPIVACAINE 1/10 % EPIDURAL INFUSION (WH - ANES)
INTRAMUSCULAR | Status: AC
  Filled 2017-08-28: qty 100

## 2017-08-28 MED ORDER — SENNOSIDES-DOCUSATE SODIUM 8.6-50 MG PO TABS
2.0000 | ORAL_TABLET | ORAL | Status: DC
  Administered 2017-08-29 (×2): 2 via ORAL
  Filled 2017-08-28: qty 2

## 2017-08-28 MED ORDER — OXYTOCIN 40 UNITS IN LACTATED RINGERS INFUSION - SIMPLE MED: 2.5000 [IU]/h | INTRAVENOUS | Status: AC

## 2017-08-28 MED ORDER — MORPHINE SULFATE (PF) 0.5 MG/ML IJ SOLN
INTRAMUSCULAR | Status: AC
  Filled 2017-08-28: qty 10

## 2017-08-28 MED ORDER — KETOROLAC TROMETHAMINE 30 MG/ML IJ SOLN
30.0000 mg | Freq: Four times a day (QID) | INTRAMUSCULAR | Status: AC | PRN
  Administered 2017-08-28: 30 mg via INTRAMUSCULAR

## 2017-08-28 MED ORDER — OXYCODONE-ACETAMINOPHEN 5-325 MG PO TABS: 2.0000 | ORAL_TABLET | ORAL | Status: DC | PRN

## 2017-08-28 MED ORDER — MENTHOL 3 MG MT LOZG: 1.0000 | LOZENGE | OROMUCOSAL | Status: DC | PRN

## 2017-08-28 MED ORDER — FENTANYL CITRATE (PF) 100 MCG/2ML IJ SOLN
100.0000 ug | INTRAMUSCULAR | Status: DC | PRN
  Administered 2017-08-28: 100 ug via INTRAVENOUS

## 2017-08-28 MED ORDER — ONDANSETRON HCL 4 MG/2ML IJ SOLN: 4.0000 mg | Freq: Four times a day (QID) | INTRAMUSCULAR | Status: DC | PRN

## 2017-08-28 MED ORDER — ACETAMINOPHEN 325 MG PO TABS
650.0000 mg | ORAL_TABLET | ORAL | Status: DC | PRN
  Administered 2017-08-28 – 2017-08-29 (×2): 650 mg via ORAL
  Filled 2017-08-28 (×2): qty 2

## 2017-08-28 MED ORDER — OXYTOCIN 10 UNIT/ML IJ SOLN
INTRAMUSCULAR | Status: AC
  Filled 2017-08-28: qty 4

## 2017-08-28 MED ORDER — FLEET ENEMA 7-19 GM/118ML RE ENEM: 1.0000 | ENEMA | RECTAL | Status: DC | PRN

## 2017-08-28 SURGICAL SUPPLY — 36 items
BENZOIN TINCTURE PRP APPL 2/3 (GAUZE/BANDAGES/DRESSINGS) ×3 IMPLANT
CELLS DAT CNTRL 66122 CELL SVR (MISCELLANEOUS) ×1 IMPLANT
CHLORAPREP W/TINT 26ML (MISCELLANEOUS) ×3 IMPLANT
CLAMP CORD UMBIL (MISCELLANEOUS) IMPLANT
CLOSURE WOUND 1/2 X4 (GAUZE/BANDAGES/DRESSINGS) ×1
CLOTH BEACON ORANGE TIMEOUT ST (SAFETY) ×3 IMPLANT
DRSG OPSITE POSTOP 4X10 (GAUZE/BANDAGES/DRESSINGS) ×3 IMPLANT
ELECT REM PT RETURN 9FT ADLT (ELECTROSURGICAL) ×3
ELECTRODE REM PT RTRN 9FT ADLT (ELECTROSURGICAL) ×1 IMPLANT
EXTRACTOR VACUUM M CUP 4 TUBE (SUCTIONS) IMPLANT
EXTRACTOR VACUUM M CUP 4' TUBE (SUCTIONS)
GLOVE BIOGEL PI IND STRL 7.0 (GLOVE) ×2 IMPLANT
GLOVE BIOGEL PI IND STRL 7.5 (GLOVE) ×2 IMPLANT
GLOVE BIOGEL PI INDICATOR 7.0 (GLOVE) ×4
GLOVE BIOGEL PI INDICATOR 7.5 (GLOVE) ×4
GLOVE ECLIPSE 7.5 STRL STRAW (GLOVE) ×3 IMPLANT
GOWN STRL REUS W/TWL LRG LVL3 (GOWN DISPOSABLE) ×9 IMPLANT
KIT ABG SYR 3ML LUER SLIP (SYRINGE) IMPLANT
NEEDLE HYPO 25X5/8 SAFETYGLIDE (NEEDLE) IMPLANT
NS IRRIG 1000ML POUR BTL (IV SOLUTION) ×3 IMPLANT
PACK C SECTION WH (CUSTOM PROCEDURE TRAY) ×3 IMPLANT
PAD ABD 8X10 STRL (GAUZE/BANDAGES/DRESSINGS) ×3 IMPLANT
PAD OB MATERNITY 4.3X12.25 (PERSONAL CARE ITEMS) ×3 IMPLANT
PENCIL SMOKE EVAC W/HOLSTER (ELECTROSURGICAL) ×3 IMPLANT
RTRCTR C-SECT PINK 25CM LRG (MISCELLANEOUS) ×3 IMPLANT
RTRCTR WOUND ALEXIS 18CM MED (MISCELLANEOUS) ×3
SPONGE GAUZE 4X4 12PLY (GAUZE/BANDAGES/DRESSINGS) ×3 IMPLANT
STRIP CLOSURE SKIN 1/2X4 (GAUZE/BANDAGES/DRESSINGS) ×2 IMPLANT
SUT VIC AB 0 CTX 36 (SUTURE) ×6
SUT VIC AB 0 CTX36XBRD ANBCTRL (SUTURE) ×3 IMPLANT
SUT VIC AB 2-0 CT1 (SUTURE) ×3 IMPLANT
SUT VIC AB 2-0 CT1 27 (SUTURE) ×2
SUT VIC AB 2-0 CT1 TAPERPNT 27 (SUTURE) ×1 IMPLANT
SUT VIC AB 4-0 KS 27 (SUTURE) ×3 IMPLANT
TOWEL OR 17X24 6PK STRL BLUE (TOWEL DISPOSABLE) ×3 IMPLANT
TRAY FOLEY BAG SILVER LF 14FR (SET/KITS/TRAYS/PACK) ×3 IMPLANT

## 2017-08-28 NOTE — Anesthesia Postprocedure Evaluation (Signed)
Anesthesia Post Note  Patient: Gina Montoya  Procedure(s) Performed: CESAREAN SECTION (N/A )     Patient location during evaluation: Mother Baby Anesthesia Type: Epidural Level of consciousness: awake and alert Pain management: pain level controlled Vital Signs Assessment: post-procedure vital signs reviewed and stable Respiratory status: spontaneous breathing, nonlabored ventilation and respiratory function stable Cardiovascular status: stable Postop Assessment: no headache, no backache and epidural receding Anesthetic complications: no    Last Vitals:  Vitals:   08/28/17 1215 08/28/17 1301  BP: 114/72 (!) 100/59  Pulse: 88 74  Resp: 13 18  Temp:    SpO2: 100% 100%    Last Pain:  Vitals:   08/28/17 1200  TempSrc: Oral  PainSc: 0-No pain   Pain Goal:                 Shanavia Makela

## 2017-08-28 NOTE — Plan of Care (Signed)
Progressing. Stood at the side of bed with assistance, Orthostatic B/P done.

## 2017-08-28 NOTE — H&P (Signed)
Obstetric History and Physical  Gina Montoya is a 26 y.o. G2P1001 with IUP at 4052w2d presenting for SOL. Patient states she has been having  regular contractions, none vaginal bleeding, intact membranes, with active fetal movement.    Prenatal Course Source of Care: [facility] with initial visit at 18wk Dating: By 9wk US --->  Estimated Date of Delivery: 09/16/17 Pregnancy complications or risks: Patient Active Problem List   Diagnosis Date Noted  . Labor and delivery, indication for care 08/28/2017  . Small for gestational age fetus affecting management of mother 07/30/2017  . Pregnancy, supervision, high-risk 07/13/2017  . 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 Late prenatal care 04/18/2017   She plans to breastfeed She desires Depo-Provera for postpartum contraception.   Sono:    @[redacted]w[redacted]d , CWD, normal anatomy, breech presentation, posterior placenta, 2085g, 12% EFW, BPP 8/8, SGA  Prenatal labs and studies: ABO, Rh: AB/Positive/-- (08/08 1039) Antibody: Negative (08/08 1039) Rubella: 1.21 (08/08 1039) RPR: Non Reactive (10/18 1015)  HBsAg: Negative (08/08 1039)  HIV:   Non-Reactive GBS: Negative 2 hr Glucola  normal Genetic screening not performed Anatomy US normal  Prenatal Transfer Tool  Maternal Diabetes: No Genetic Screening: Declined Maternal Ultrasounds/Referrals: Normal Fetal Ultrasounds or other Referrals:  None Maternal Substance Abuse:  No Significant Maternal Medications:  None Significant Maternal Lab Results: None  Past Medical History:  Diagnosis Date  . Medical history non-contributory     Past Surgical History:  Procedure Laterality Date  . NO PAST SURGERIES      OB History  Gravida Para Term Preterm AB Living  2 1 1     1   SAB TAB Ectopic Multiple Live Births          1    # Outcome Date GA Lbr Len/2nd Weight Sex Delivery Anes PTL Lv  2 Current           1 Term 02/02/16    F Vag-Spont EPI N LIV      Social History   Socioeconomic  History  . Marital status: Single    Spouse name: None  . Number of children: None  . Years of education: None  . Highest education level: None  Social Needs  . Financial resource strain: None  . Food insecurity - worry: None  . Food insecurity - inability: None  . Transportation needs - medical: None  . Transportation needs - non-medical: None  Occupational History  . None  Tobacco Use  . Smoking status: Never Smoker  . Smokeless tobacco: Never Used  Substance and Sexual Activity  . Alcohol use: No  . Drug use: No  . Sexual activity: Yes    Birth control/protection: None  Other Topics Concern  . None  Social History Narrative  . None    History reviewed. No pertinent family history.  Medications Prior to Admission  Medication Sig Dispense Refill Last Dose  . Prenatal Vit-Fe Fumarate-FA (PREPLUS) 27-1 MG TABS Take 1 tablet by mouth daily. 30 tablet 13 08/27/2017 at Unknown time    No Known Allergies  Review of Systems: Negative except for what is mentioned in HPI.  Physical Exam: BP (!) 130/115 (BP Location: Right Arm)   Pulse 96   Temp 97.9 F (36.6 C) (Oral)   Resp 18   Ht 5\' 2"  (1.575 m)   Wt 60.8 kg (134 lb)   LMP 11/08/2016   SpO2 100%   BMI 24.51 kg/m  CONSTITUTIONAL: Well-developed, well-nourished female in no acute distress.  HENT:  Normocephalic, atraumatic, External right and left ear normal. Oropharynx is clear and moist EYES: Conjunctivae and EOM are normal. Pupils are equal, round, and reactive to light. No scleral icterus.  NECK: Normal range of motion, supple, no masses SKIN: Skin is warm and dry. No rash noted. Not diaphoretic. No erythema. No pallor. NEUROLOGIC: Alert and oriented to person, place, and time. Normal reflexes, muscle tone coordination. No cranial nerve deficit noted. PSYCHIATRIC: Normal mood and affect. Normal behavior. Normal judgment and thought content. CARDIOVASCULAR: Normal heart rate noted, regular rhythm RESPIRATORY:  Effort and breath sounds normal, no problems with respiration noted ABDOMEN: Soft, nontender, nondistended, gravid. MUSCULOSKELETAL: Normal range of motion. No edema and no tenderness. 2+ distal pulses.  Cervical Exam: Dilation: 7 Effacement (%): 70 Cervical Position: Middle Station: -2 Presentation: Undeterminable(vertex by ultrasound) Exam by:: Ivonne AndrewV. Smith CNM  Presentation: hand presentation FHT:  Baseline rate 125 bpm   Variability moderate  Accelerations present   Decelerations none Contractions: Every 2 mins   Pertinent Labs/Studies:   Results for orders placed or performed during the hospital encounter of 08/28/17 (from the past 24 hour(s))  Urinalysis, Routine w reflex microscopic     Status: Abnormal   Collection Time: 08/28/17  7:36 AM  Result Value Ref Range   Color, Urine YELLOW YELLOW   APPearance HAZY (A) CLEAR   Specific Gravity, Urine 1.010 1.005 - 1.030   pH 8.0 5.0 - 8.0   Glucose, UA NEGATIVE NEGATIVE mg/dL   Hgb urine dipstick NEGATIVE NEGATIVE   Bilirubin Urine NEGATIVE NEGATIVE   Ketones, ur NEGATIVE NEGATIVE mg/dL   Protein, ur NEGATIVE NEGATIVE mg/dL   Nitrite NEGATIVE NEGATIVE   Leukocytes, UA LARGE (A) NEGATIVE   RBC / HPF 0-5 0 - 5 RBC/hpf   WBC, UA 6-30 0 - 5 WBC/hpf   Bacteria, UA RARE (A) NONE SEEN   Squamous Epithelial / LPF 6-30 (A) NONE SEEN   Mucus PRESENT   CBC     Status: Abnormal   Collection Time: 08/28/17  8:33 AM  Result Value Ref Range   WBC 8.4 4.0 - 10.5 K/uL   RBC 5.30 (H) 3.87 - 5.11 MIL/uL   Hemoglobin 17.0 (H) 12.0 - 15.0 g/dL   HCT 40.949.1 (H) 81.136.0 - 91.446.0 %   MCV 92.6 78.0 - 100.0 fL   MCH 32.1 26.0 - 34.0 pg   MCHC 34.6 30.0 - 36.0 g/dL   RDW 78.214.1 95.611.5 - 21.315.5 %   Platelets 176 150 - 400 K/uL    Assessment : Gina Montoya is a 26 y.o. G2P1001 at 664w2d being admitted for labor.  Plan: Labor: Expectant management. Hoping baby will turn or hand presentation move so that a vaginal delivery can occur. Patient informed  about possible need for c-section.  Place epidural for analgesia FWB: Reassuring fetal heart tracing.  GBS negative Delivery plan: Hopeful for vaginal delivery   Caryl AdaJazma Jadon Ressler, DO OB Fellow Faculty Practice, [facility] [place] 08/28/2017, 9:11 AM

## 2017-08-28 NOTE — Progress Notes (Signed)
Patient ID: Gina Montoya, female  Gina Montoya DOB: [birth year], 26 y.o.   MRN: [id number]  Bedside US shows baby head maternal right with hand and foot by cervix. Attempts to turn baby were successful in positioning baby vertex and was successful at hand moving out of the cervix, but foot remained preceding fetal head. SROM with large amount of clear fluid. With baby in pike position, vaginal delivery would remain difficult.   The risks of cesarean section discussed with the patient included but were not limited to: bleeding which may require transfusion or reoperation; infection which may require antibiotics; injury to bowel, bladder, ureters or other surrounding organs; injury to the fetus; need for additional procedures including hysterectomy in the event of a life-threatening hemorrhage; placental abnormalities wth subsequent pregnancies, incisional problems, thromboembolic phenomenon and other postoperative/anesthesia complications. The patient concurred with the proposed plan, giving informed written consent for the procedure.   Patient has been NPO since last night, she will remain NPO for procedure. Anesthesia and OR aware.  Preoperative prophylactic Ancef ordered on call to the OR.  To OR when ready.  Levie HeritageStinson, Jacob J, DO 08/28/2017 10:15 AM

## 2017-08-28 NOTE — Op Note (Signed)
Gina Montoya PROCEDURE DATE: 08/28/2017  PREOPERATIVE DIAGNOSES: Intrauterine pregnancy at 5978w2d weeks gestation; malpresentation: footling breech  POSTOPERATIVE DIAGNOSES: The same  PROCEDURE: Primary Low Transverse Cesarean Section  SURGEON:  Dr. Candelaria CelesteJacob Stinson  ASSISTANT:  Dr. Caryl AdaJazma Shavonn Montoya  ANESTHESIOLOGY TEAM: Anesthesiologist: Bethena Midgetddono, Ernest, MD CRNA: Cleda ClarksBrowder, Robyn R, CRNA  INDICATIONS: Gina Montoya is a 26 y.o. [phone] at 7078w2d here for cesarean section secondary to the indications listed under preoperative diagnoses; please see preoperative note for further details.  The risks of cesarean section were discussed with the patient including but were not limited to: bleeding which may require transfusion or reoperation; infection which may require antibiotics; injury to bowel, bladder, ureters or other surrounding organs; injury to the fetus; need for additional procedures including hysterectomy in the event of a life-threatening hemorrhage; placental abnormalities wth subsequent pregnancies, incisional problems, thromboembolic phenomenon and other postoperative/anesthesia complications.   The patient concurred with the proposed plan, giving informed written consent for the procedure.    FINDINGS:  Viable female infant in cephalic presentation.  Apgars 8 and 9.  Clear amniotic fluid.  Intact placenta, three vessel cord.  Normal uterus, fallopian tubes and ovaries bilaterally.  ANESTHESIA: Epidural  INTRAVENOUS FLUIDS: 1200 ml   ESTIMATED BLOOD LOSS: 688 ml URINE OUTPUT:  150 ml SPECIMENS: Placenta sent to L&D COMPLICATIONS: None immediate  PROCEDURE IN DETAIL:  The patient preoperatively received intravenous antibiotics and had sequential compression devices applied to her lower extremities.  She was then taken to the operating room where the epidural anesthesia was dosed up to surgical level and was found to be adequate. She was then placed in a dorsal supine position with  a leftward tilt, and prepped and draped in a sterile manner.  A foley catheter was placed into her bladder and attached to constant gravity.  After an adequate timeout was performed, a Pfannenstiel skin incision was made with scalpel and carried through to the underlying layer of fascia. The fascia was incised in the midline, and this incision was extended bilaterally bluntly.  Kocher clamps were applied to the superior aspect of the fascial incision and the underlying rectus muscles were dissected off bluntly. A similar process was carried out on the inferior aspect of the fascial incision. The rectus muscles were separated in the midline bluntly and the peritoneum was entered bluntly. An Alexis retractor was placed to aid in visualization of the uterus. Attention was turned to the lower uterine segment where a low transverse hysterotomy was made with a scalpel and extended bilaterally bluntly. The infant was successfully delivered, the cord was clamped and cut after one minute, and the infant was handed over to the awaiting neonatology team. Uterine massage was then administered, and the placenta delivered intact with a three-vessel cord. The uterus was then cleared of clots and debris.  The hysterotomy was closed with 0 Vicryl in a running locked fashion, and an imbricating layer was also placed with 0 Vicryl.  One figure-of-eight 2 Vicryl serosal stitche was placed to help with hemostasis.  The pelvis was cleared of all clot and debris. Hemostasis was confirmed on all surfaces. The abdomen and the pelvis were cleared of all clot and debris and the Jon Gillslexis was removed. The peritoneum was closed with a 2-0 Vicryl running stitch and the rectus muscles were reapproximated using same running stitch. The fascia was then closed using 0 Vicryl in a running fashion.  The subcutaneous layer was irrigated.  The skin was closed with a 4-0 Vicryl subcuticular stitch.  The patient tolerated the procedure well. Sponge, lap,  instrument and needle counts were correct x 3.  She was taken to the recovery room in stable condition.    Gina AdaJazma Ayush Boulet, DO OB Fellow

## 2017-08-28 NOTE — Transfer of Care (Signed)
Immediate Anesthesia Transfer of Care Note  Patient: Gina Montoya  Procedure(s) Performed: CESAREAN SECTION (N/A )  Patient Location: PACU  Anesthesia Type:Epidural  Level of Consciousness: awake, alert  and oriented  Airway & Oxygen Therapy: Patient Spontanous Breathing  Post-op Assessment: Report given to RN and Post -op Vital signs reviewed and stable  Post vital signs: Reviewed and stable  Last Vitals:  Vitals:   08/28/17 1006 08/28/17 1011  BP: 128/82 (!) 147/127  Pulse: 84 81  Resp:    Temp:    SpO2:      Last Pain:  Vitals:   08/28/17 0842  TempSrc: Oral  PainSc:          Complications: No apparent anesthesia complications

## 2017-08-28 NOTE — MAU Note (Signed)
Pt presents with c/o ctxs that began @ 0300 this morning.  States ctxs are every 5 minutes.  Denies LOF or VB.  Reports lost mucous plug.  Reports +FM.

## 2017-08-28 NOTE — Anesthesia Preprocedure Evaluation (Signed)

## 2017-08-28 NOTE — Anesthesia Procedure Notes (Signed)
Epidural Patient location during procedure: OB Start time: 08/28/2017 9:00 AM End time: 08/28/2017 9:11 AM  Staffing Anesthesiologist: Bethena Midgetddono, Camillia Marcy, MD  Preanesthetic Checklist Completed: patient identified, site marked, surgical consent, pre-op evaluation, timeout performed, IV checked, risks and benefits discussed and monitors and equipment checked  Epidural Patient position: sitting Prep: site prepped and draped and DuraPrep Patient monitoring: continuous pulse ox and blood pressure Approach: midline Location: L3-L4 Injection technique: LOR air  Needle:  Needle type: Tuohy  Needle gauge: 17 G Needle length: 9 cm and 9 Needle insertion depth: 5 cm cm Catheter type: closed end flexible Catheter size: 19 Gauge Catheter at skin depth: 10 cm Test dose: negative  Assessment Events: blood not aspirated, injection not painful, no injection resistance, negative IV test and no paresthesia

## 2017-08-28 NOTE — Anesthesia Postprocedure Evaluation (Signed)
Anesthesia Post Note  Patient: Gina Montoya  Procedure(s) Performed: CESAREAN SECTION (N/A )     Patient location during evaluation: [facility] Anesthesia Type: Epidural Level of consciousness: awake and alert and oriented Pain management: pain level controlled Vital Signs Assessment: post-procedure vital signs reviewed and stable Respiratory status: spontaneous breathing Cardiovascular status: blood pressure returned to baseline Postop Assessment: no headache, no backache, epidural receding, patient able to bend at knees, no apparent nausea or vomiting and adequate PO intake Anesthetic complications: no    Last Vitals:  Vitals:   08/28/17 1510 08/28/17 1609  BP: 112/74 113/75  Pulse: 75 78  Resp: 18 18  Temp: (!) 36.4 C 36.6 C  SpO2: 100% 100%    Last Pain:  Vitals:   08/28/17 1611  TempSrc:   PainSc: 7    Pain Goal:                 [facility] CenterMARSHALL,Weldon Nouri

## 2017-08-28 NOTE — Progress Notes (Signed)
Patient did not want to get up yet she wanted to finish eating and rest.

## 2017-08-29 LAB — CBC
HEMATOCRIT: 36.5 % (ref 36.0–46.0)
HEMOGLOBIN: 12.4 g/dL (ref 12.0–15.0)
MCH: 31.3 pg (ref 26.0–34.0)
MCHC: 34 g/dL (ref 30.0–36.0)
MCV: 92.2 fL (ref 78.0–100.0)
Platelets: 135 10*3/uL — ABNORMAL LOW (ref 150–400)
RBC: 3.96 MIL/uL (ref 3.87–5.11)
RDW: 14.1 % (ref 11.5–15.5)
WBC: 13 10*3/uL — ABNORMAL HIGH (ref 4.0–10.5)

## 2017-08-29 LAB — BIRTH TISSUE RECOVERY COLLECTION (PLACENTA DONATION)

## 2017-08-29 NOTE — Progress Notes (Signed)
CSW received consult due to score 10 on Edinburgh Depression Screen.    LCSW met with MOB alone at the bedside. MOB bonding with baby and breastfeeding, reporting he is latching and doing well. She reports overall she is very happy and excited for her daughter (who is [age] years old) to be a big sister and engage with son. MOB reports she has all needs met for baby except for car seat. She reports baby came too son at 59 weeks and she does not have a car seat for baby. LCSW explained policy in which MOB is agreeable and RN made aware at DC to call for car seat.  MOB reports no hx of depression or anxiety. She voices no signs or symptoms related to depression and feels very comfortable and engaged.   CSW provided education regarding Baby Blues vs PMADs and provided MOB with information about support groups held at [facility] encouraged MOB to evaluate her mental health throughout the postpartum period with the use of the New Mom Checklist developed by Postpartum Progress and notify a medical professional if symptoms arise.   Lane Hacker, MSW Clinical Social Work: [employer]

## 2017-08-29 NOTE — Lactation Note (Signed)
This note was copied from a baby's chart. Lactation Consultation Note Baby 14 hrs old. Weight 5.0lbs. Mom has 3820 month old that she Bf for 11 months. Mom has everted nipples, very compressible. Assisted in football. Discussed body alignment, STS, importance of I&O, cluster feeding, supply and demand. Educated on 37 week 5.0 lb baby behavior, feeding habits, and supplementing.  RN set up DEBP. Instructed on pumping every 3 hrs w/hand expression after pumping to collect colostrum for supplementing. Gave supplementing amount guidelines information sheet. Mom in agreement to give formula to equal amount needed if doesn't have enough colostrum. Encouraged breast compression occasionally while BF, assessing for transfer before and after BF.  Encouraged wear a hat, STS, keeping back of baby covered, limiting feeding time to 30 min.  Reviewed with mom supply and demand, not stimulating breast or feeding colostrum will cut down on milk supply. Mom states understanding.  Instructed RN to give mom 22 cal Similac for supplementing formula if needed to equal amount needed. Mom encouraged to feed baby 8-12 times/24 hours and with feeding cues. Mom encouraged to waken baby for feeding if hasn't cued in 3 hrs. WH/LC brochure given w/resources, support groups and LC services.  Patient Name: Gina Montoya ZOXWR'UToday's Date: 08/29/2017 Reason for consult: Initial assessment;Early term 37-38.6wks;Infant < 6lbs   Maternal Data Has patient been taught Hand Expression?: Yes Does the patient have breastfeeding experience prior to this delivery?: Yes  Feeding Feeding Type: Breast Fed  LATCH Score Latch: Grasps breast easily, tongue down, lips flanged, rhythmical sucking.  Audible Swallowing: Spontaneous and intermittent  Type of Nipple: Everted at rest and after stimulation  Comfort (Breast/Nipple): Soft / non-tender  Hold (Positioning): Assistance needed to correctly position infant at breast and  maintain latch.  LATCH Score: 9  Interventions Interventions: Breast feeding basics reviewed;Breast compression;Assisted with latch;Adjust position;Skin to skin;Support pillows;DEBP;Breast massage;Position options;Hand express;Expressed milk  Lactation Tools Discussed/Used Tools: Pump Breast pump type: Double-Electric Breast Pump WIC Program: No Pump Review: Setup, frequency, and cleaning;Milk Storage Initiated by:: RN Date initiated:: 08/29/17   Consult Status Consult Status: Follow-up Date: 08/29/17 Follow-up type: In-patient    Charyl DancerCARVER, Laniya Friedl G 08/29/2017, 12:56 AM

## 2017-08-29 NOTE — Progress Notes (Signed)
POSTPARTUM PROGRESS NOTE  Post Partum Day 1 Subjective:  Gina Montoya is a 26 y.o. [id number] 1631w3d s/p pLTCS for malpresentation.  No acute events overnight.  Pt denies problems with ambulating, voiding or po intake.  She denies nausea or vomiting.  Pain is moderately controlled.  She has not had flatus. She has not had bowel movement.  Lochia Minimal.   Objective: Blood pressure 110/65, pulse 75, temperature 97.6 F (36.4 C), temperature source Oral, resp. rate 18, height 5\' 2"  (1.575 m), weight 60.8 kg (134 lb), SpO2 99 %, unknown if currently breastfeeding.  Physical Exam:  General: alert, cooperative and no distress Lochia:normal flow Chest: no respiratory distress Heart:regular rate, distal pulses intact Abdomen: soft, nontender,  Uterine Fundus: firm, appropriately tender DVT Evaluation: No calf swelling or tenderness Extremities: no edema  Recent Labs    08/28/17 0833  HGB 17.0*  HCT 49.1*    Assessment/Plan:  ASSESSMENT: Gina Montoya is a 26 y.o. [id number] 1431w3d s/p pLTCS for malpresentation  Plan for d/c on 12/21   LOS: 1 day   Zetha Kuhar BlandDO 08/29/2017, 5:34 AM

## 2017-08-30 ENCOUNTER — Encounter (HOSPITAL_COMMUNITY): Payer: Self-pay | Admitting: *Deleted

## 2017-08-30 MED ORDER — IBUPROFEN 600 MG PO TABS: 600.0000 mg | ORAL_TABLET | Freq: Four times a day (QID) | ORAL | 0 refills | Status: DC | PRN

## 2017-08-30 MED ORDER — OXYCODONE HCL 5 MG PO TABS: 5.0000 mg | ORAL_TABLET | ORAL | 0 refills | Status: DC | PRN

## 2017-08-30 NOTE — Lactation Note (Signed)
This note was copied from a baby's chart. Lactation Consultation Note Noted mom wasn't giving amount of supplementation needed for adequate nutrition for 4.15 lb baby. Mom stated she has pumped some but wasn't getting anything so she hand expressed some colostrum and that was what she was giving him. LC reviewed again the amount needed according to hours of age. At this age baby is to be supplemented 10-20 ml. At this time mom has 4 ml colostrum. Explained mom needs calories and Similac 22 cal needed. Encouraged mom to use pump for extra stimulation d/t small baby doesn't stimulate breast as much as bigger baby.  Baby has good output, praised mom that the baby is getting something from her breast d/t good out put, but baby needs more calories. Mom agreed to give formula until colostrum amount increases. Mom understands that giving formula is a medically needed indication. Praised mom for working hard in giving BM and BF which is her choice, mom understands that if she doesn't cont. To BF and pump, just relying on formula it will cut down her milk supply. Mom has all intentions of BF giving her baby the best of nutrition and understands baby needs a little more until her milk volume increases.  Patient Name: Gina Montoya Reason for consult: Follow-up assessment;Infant < 6lbs   Maternal Data    Feeding Feeding Type: Bottle Fed - Formula Nipple Type: Slow - flow  LATCH Score                   Interventions Interventions: Expressed milk;DEBP  Lactation Tools Discussed/Used Breast pump type: Double-Electric Breast Pump   Consult Status Consult Status: Follow-up Date: 08/30/17 Follow-up type: In-patient    Charyl DancerCARVER, Deandrae Wajda G Montoya, 12:51 AM

## 2017-08-30 NOTE — Discharge Instructions (Signed)

## 2017-08-30 NOTE — Discharge Summary (Signed)
OB Discharge Summary     Patient Name: Gina Montoya DOB: [birth year] MRN: [id number]  Date of admission: 08/28/2017 Delivering MD: Levie HeritageSTINSON, JACOB J   Date of discharge: 08/30/2017  Admitting diagnosis: 37WKS, LABOR Intrauterine pregnancy: 303w4d     Secondary diagnosis:  Active Problems:   Labor and delivery, indication for care  Additional problems: SGA; active labor with malpresentation; hx PPD     Discharge diagnosis: Term Pregnancy Delivered                                                                                                Post partum procedures:none  Augmentation: N/A  Complications: None  Hospital course:  Onset of Labor With Unplanned C/S  26 y.o. yo [id number] at 383w4d was admitted in Active Labor on 08/28/2017. Patient had a labor course significant for presenting in active labor with vtx presentation by U/S. After epidural, U/S then showed hand and foot by cx. Attempted ECV which was unsuccessful, followed by SROM with baby in pike position. She was then taken for pLTCS. Membrane Rupture Time/Date: 10:01 AM ,08/28/2017   The patient went for cesarean section due to Malpresentation, and delivered a Viable infant,08/28/2017  Details of operation can be found in separate operative note. Patient had an uncomplicated postpartum course.  She is ambulating,tolerating a regular diet, passing flatus, and urinating well.  Patient is discharged home in stable condition 08/30/17. She has a hx of PPD. Was seen by SW while inpt.   Physical exam  Vitals:   08/29/17 0738 08/29/17 1000 08/29/17 1832 08/30/17 0527  BP: (!) 114/53  109/62 (!) 106/57  Pulse: 81  82 75  Resp: 18  17   Temp: 97.7 F (36.5 C)  98.2 F (36.8 C)   TempSrc: Oral  Axillary   SpO2: 100% 100%    Weight:      Height:       General: alert and cooperative Lochia: appropriate Uterine Fundus: firm Incision: pressure dsg intact, dry DVT Evaluation: No evidence of DVT seen on physical  exam. Labs: Lab Results  Component Value Date   WBC 13.0 (H) 08/29/2017   HGB 12.4 08/29/2017   HCT 36.5 08/29/2017   MCV 92.2 08/29/2017   PLT 135 (L) 08/29/2017   No flowsheet data found.  Discharge instruction: per After Visit Summary and "Baby and Me Booklet".  After visit meds:  Allergies as of 08/30/2017   No Known Allergies     Medication List    TAKE these medications   ibuprofen 600 MG tablet Commonly known as:  ADVIL,MOTRIN Take 1 tablet (600 mg total) by mouth every 6 (six) hours as needed.   oxyCODONE 5 MG immediate release tablet Commonly known as:  Oxy IR/ROXICODONE Take 1 tablet (5 mg total) by mouth every 4 (four) hours as needed (pain scale 4-7).   PREPLUS 27-1 MG Tabs Take 1 tablet by mouth daily.       Diet: routine diet  Activity: Advance as tolerated. Pelvic rest for 6 weeks.   Outpatient follow AO:ZHYQMVHQup:incision check and mental health check in 1-2 weeks, then  postpartum visit in 4 weeks Follow up Appt:No future appointments. Follow up Visit:No Follow-up on file.  Postpartum contraception: Depo Provera  Newborn Data: Live born female  Birth Weight: 5 lb 0.6 oz (2285 g) APGAR: 8, 9  Newborn Delivery   Birth date/time:  08/28/2017 10:33:00 Delivery type:  C-Section, Low Transverse C-section categorization:  Primary     Baby Feeding: Breast Disposition:home with mother   08/30/2017 Cam HaiSHAW, Bingham Millette, CNM 7:27 AM

## 2017-08-30 NOTE — Lactation Note (Signed)
This note was copied from a baby's chart. Lactation Consultation Note: Experienced BF mom. This baby is now <5 lbs Mom has not been supplementing after nursing. Encouraged to supplement after every feeding. Reviewed this baby is different than her first. Encouragement given. Has Evenflo pump for home that worked well for first baby. Encouraged to use EBM first then formula to follow guidelines. No questions at present. Reviewed our phone number, OP appointments and BFSG as resources for support after DC. To call prn  Patient Name: Gina Montoya ZOXWR'UToday's Date: 08/30/2017 Reason for consult: Follow-up assessment;Early term 37-38.6wks;Infant < 6lbs   Maternal Data Formula Feeding for Exclusion: No Has patient been taught Hand Expression?: Yes Does the patient have breastfeeding experience prior to this delivery?: Yes  Feeding Feeding Type: Breast Fed Length of feed: 15 min  LATCH Score                   Interventions    Lactation Tools Discussed/Used     Consult Status Consult Status: Complete    Pamelia HoitWeeks, Obdulio Mash D 08/30/2017, 10:29 AM

## 2017-08-31 ENCOUNTER — Ambulatory Visit (HOSPITAL_COMMUNITY): Admission: RE | Admit: 2017-08-31 | Payer: Medicaid Other | Source: Ambulatory Visit

## 2017-08-31 ENCOUNTER — Encounter: Payer: Medicaid Other | Admitting: Obstetrics and Gynecology

## 2017-09-05 ENCOUNTER — Telehealth: Payer: Self-pay | Admitting: Obstetrics & Gynecology

## 2017-09-05 NOTE — Telephone Encounter (Signed)
Patient needed her Rx filled. Looks like it was printed.

## 2017-09-06 ENCOUNTER — Telehealth: Payer: Self-pay | Admitting: *Deleted

## 2017-09-06 DIAGNOSIS — G8918 Other acute postprocedural pain: Secondary | ICD-10-CM

## 2017-09-06 MED ORDER — IBUPROFEN 600 MG PO TABS: 600.0000 mg | ORAL_TABLET | Freq: Four times a day (QID) | ORAL | 0 refills | Status: DC | PRN

## 2017-09-06 NOTE — Telephone Encounter (Signed)
Received a transfer from registrars stating patient has called back several times and is very upset and refusing to hang up, has been told to go [facility]U for evaluation.  Franchot GalloKhasha states when she was discharged from the hospital she took her written prescription to her pharmacy and they did not fill it ; states they told it had to be approved by doctor. States she has been calling since then and is still having pain. I apologized for this happening and asked what she has been doing for pain since then. She and her support person states she has been taking ibuprofen and crying a lot.  I explained to her that at this point  She is 9 days post op and we do not prescibe pain medicine this far out without examining her ; that usually you should be having some mild pain that is relieved with ibuprofen. I explained she will need to come to office to be seen and asked what time she can come. She and husband were saying he had to go to work in 4 hours , she was saying has transportation issues and can't come easily.  I informed her I can discuss with provider and see if they can order something not as strong and I will call her back within the hour.  She asked me to do that.  I called [facility] and they verfied she did not get the oxycodone filled , needed prior auth.  I discussed Erielle's request for oxycodone and complaints with Dr.Davis. Called Adanely back and informed her I discussed her request with Dr.Davis and she has to come to be seen - we will not give her pain meds without being seen.  She states can she just get more ibuprofen . I infomred her we can refill the ibuprofen. I asked if she was having any issues with her c/s wound and she / her suport person said it looks fine. I explained I will send in rx for ibuprofen and reviewed her post op appt with her and instructed her to go to [facility]  Or call us if she has any problems with her wound. She voices understanding.

## 2017-09-07 ENCOUNTER — Ambulatory Visit (HOSPITAL_COMMUNITY): Payer: Medicaid Other

## 2017-09-07 ENCOUNTER — Encounter: Payer: Medicaid Other | Admitting: Student

## 2017-09-13 ENCOUNTER — Ambulatory Visit (INDEPENDENT_AMBULATORY_CARE_PROVIDER_SITE_OTHER): Payer: Medicaid Other | Admitting: *Deleted

## 2017-09-13 ENCOUNTER — Encounter: Payer: Self-pay | Admitting: Student

## 2017-09-13 VITALS — BP 122/80 | HR 64 | Wt 121.3 lb

## 2017-09-13 DIAGNOSIS — Z5189 Encounter for other specified aftercare: Secondary | ICD-10-CM

## 2017-09-13 DIAGNOSIS — Z029 Encounter for administrative examinations, unspecified: Secondary | ICD-10-CM

## 2017-09-13 NOTE — Progress Notes (Signed)
In for wound check, s/p c/s 08/28/17.  Wound clean , dry, intact with steristrips.  Instructed patient to keep wound clean , dry and to remove steristrips at least 2 per day during shower.  Instructed to call us or go to [facility] if any redness, edema, swelling, opening, etc.  Reviewed post partum appt with her. She voices understanding.

## 2017-09-13 NOTE — Progress Notes (Signed)
Patient seen and assessed by nursing staff.  Agree with documentation and plan.  

## 2017-09-20 ENCOUNTER — Encounter: Payer: Self-pay | Admitting: Student

## 2017-10-04 ENCOUNTER — Encounter: Payer: Self-pay | Admitting: Family Medicine

## 2017-10-04 ENCOUNTER — Ambulatory Visit (INDEPENDENT_AMBULATORY_CARE_PROVIDER_SITE_OTHER): Payer: Medicaid Other | Admitting: Family Medicine

## 2017-10-04 DIAGNOSIS — Z1389 Encounter for screening for other disorder: Secondary | ICD-10-CM

## 2017-10-04 DIAGNOSIS — Z3042 Encounter for surveillance of injectable contraceptive: Secondary | ICD-10-CM | POA: Diagnosis not present

## 2017-10-04 DIAGNOSIS — Z30013 Encounter for initial prescription of injectable contraceptive: Secondary | ICD-10-CM

## 2017-10-04 LAB — POCT PREGNANCY, URINE: Preg Test, Ur: NEGATIVE

## 2017-10-04 MED ORDER — MEDROXYPROGESTERONE ACETATE 150 MG/ML IM SUSP
150.0000 mg | Freq: Once | INTRAMUSCULAR | Status: AC
  Administered 2017-10-04: 150 mg via INTRAMUSCULAR

## 2017-10-04 NOTE — Progress Notes (Signed)
Pt wants to talk about getting the Depo Shot.

## 2017-10-04 NOTE — Progress Notes (Signed)
Subjective:     Gina Montoya is a 27 y.o. female who presents for a postpartum visit. She is 5 weeks postpartum following a low cervical transverse Cesarean section. I have fully reviewed the prenatal and intrapartum course. The delivery was at 37 gestational weeks. Outcome: primary cesarean section, low transverse incision. Anesthesia: epidural. Postpartum course has been complicated by postpartum depression - better now. Baby's course has been normal. Baby is feeding by breast. Bleeding staining only. Bowel function is normal. Bladder function is normal. Patient is sexually active. Contraception method is none. Postpartum depression screening: negative.  The following portions of the patient's history were reviewed and updated as appropriate: allergies, current medications, past family history, past medical history, past social history, past surgical history and problem list.  Review of Systems Pertinent items are noted in HPI.   Objective:    There were no vitals taken for this visit.  General:  alert, cooperative and no distress  Lungs: clear to auscultation bilaterally  Heart:  regular rate and rhythm, S1, S2 normal, no murmur, click, rub or gallop  Abdomen: soft, non-tender; bowel sounds normal; no masses,  no organomegaly. Incision clean, dry, intact. Well healed.        Assessment:     normal postpartum exam. Pap smear not done at today's visit.   Plan:    1. Contraception: Depo-Provera injections 2. Follow up in: 1 year or as needed.

## 2017-12-20 ENCOUNTER — Encounter: Payer: Self-pay | Admitting: Obstetrics & Gynecology

## 2017-12-20 ENCOUNTER — Ambulatory Visit (INDEPENDENT_AMBULATORY_CARE_PROVIDER_SITE_OTHER): Payer: Medicaid Other | Admitting: Obstetrics & Gynecology

## 2017-12-20 ENCOUNTER — Other Ambulatory Visit (HOSPITAL_COMMUNITY)
Admission: RE | Admit: 2017-12-20 | Discharge: 2017-12-20 | Disposition: A | Discharge status: Home / Self Care | Payer: Medicaid Other | Source: Ambulatory Visit | Attending: Obstetrics & Gynecology | Admitting: Obstetrics & Gynecology

## 2017-12-20 ENCOUNTER — Ambulatory Visit: Payer: Medicaid Other

## 2017-12-20 VITALS — Ht 62.0 in | Wt 127.4 lb

## 2017-12-20 DIAGNOSIS — Z309 Encounter for contraceptive management, unspecified: Secondary | ICD-10-CM | POA: Diagnosis not present

## 2017-12-20 DIAGNOSIS — Z01419 Encounter for gynecological examination (general) (routine) without abnormal findings: Secondary | ICD-10-CM | POA: Insufficient documentation

## 2017-12-20 DIAGNOSIS — Z3042 Encounter for surveillance of injectable contraceptive: Secondary | ICD-10-CM

## 2017-12-20 MED ORDER — MEDROXYPROGESTERONE ACETATE 150 MG/ML IM SUSP
150.0000 mg | Freq: Once | INTRAMUSCULAR | Status: AC
  Administered 2017-12-20: 150 mg via INTRAMUSCULAR

## 2017-12-20 NOTE — Addendum Note (Signed)
Addended by: Mikey BussingWILSON, River Mckercher L on: 12/20/2017 04:49 PM   Modules accepted: Orders

## 2017-12-20 NOTE — Progress Notes (Signed)
Subjective:     Gina Montoya is a 27 y.o. female here for a routine exam.  LMP: amenorrhea on Depo.  Current complaints: pt came in for Depo Provera shot. Needs annual prior to Depo.    Gynecologic History No LMP recorded. Contraception: Depo-Provera injections Last Pap: unknown >3 years prev.  Results were: normal Last mammogram: n/a.   Obstetric History OB History  Gravida Para Term Preterm AB Living  2 2 2     2   SAB TAB Ectopic Multiple Live Births        1 2    # Outcome Date GA Lbr Len/2nd Weight Sex Delivery Anes PTL Lv  2A Term 08/28/17 8277w2d  5 lb 0.6 oz (2.285 kg) M CS-LTranv EPI  LIV  2B Gravida           1 Term 02/02/16    F Vag-Spont EPI N LIV     The following portions of the patient's history were reviewed and updated as appropriate: allergies, current medications, past family history, past medical history, past social history, past surgical history and problem list.  Review of Systems Pertinent items are noted in HPI.    Objective:  Ht 5\' 2"  (1.575 m)   Wt 127 lb 6.4 oz (57.8 kg)   BMI 23.30 kg/m  General Appearance:    Alert, cooperative, no distress, appears stated age  Head:    Normocephalic, without obvious abnormality, atraumatic  Eyes:    conjunctiva/corneas clear, EOM's intact, both eyes  Ears:    Normal external ear canals, both ears  Nose:   Nares normal, septum midline, mucosa normal, no drainage    or sinus tenderness  Throat:   Lips, mucosa, and tongue normal; teeth and gums normal  Neck:   Supple, symmetrical, trachea midline, no adenopathy;    thyroid:  no enlargement/tenderness/nodules  Back:     Symmetric, no curvature, ROM normal, no CVA tenderness  Lungs:     Clear to auscultation bilaterally, respirations unlabored  Chest Wall:    No tenderness or deformity   Heart:    Regular rate and rhythm, S1 and S2 normal, no murmur, rub   or gallop  Breast Exam:    No tenderness, masses, or nipple abnormality  Abdomen:     Soft,  non-tender, bowel sounds active all four quadrants,    no masses, no organomegaly  Genitalia:    Normal female without lesion, discharge or tenderness     Extremities:   Extremities normal, atraumatic, no cyanosis or edema  Pulses:   2+ and symmetric all extremities  Skin:   Skin color, texture, turgor normal, no rashes or lesions      Assessment:    Healthy female exam.   Contraception management- wants to cont Depo Provera    Plan:    Follow up in: 1 year. for Annual Needs 12 week f/u for Depo provera Rec Ca++ plus Vit D daily F/u PAP and cx  Gina Montoya L. Harraway-Smith, M.D., Evern CoreFACOG

## 2017-12-20 NOTE — Patient Instructions (Signed)
Calcium; Vitamin D chewable tablet What is this medicine? CALCIUM; VITAMIN D (KAL see um; VYE ta min D) is a vitamin supplement. It is used to prevent conditions of low calcium and vitamin D. This medicine may be used for other purposes; ask your health care provider or pharmacist if you have questions. COMMON BRAND NAME(S): Calcet Citrate Soft Chew, Caltrate 600+D, Citracal Calcium, Citracal Creamy Bites, Os-Cal 500 Plus D, OSCAL with Vitamin D3, Oysco 500 + D What should I tell my health care provider before I take this medicine? They need to know if you have any of these conditions: -constipation -dehydration -heart disease -high level of calcium or vitamin D in the blood -high level of phosphate in the blood -kidney disease -kidney stones -liver disease -parathyroid disease -sarcoidosis -stomach ulcer or obstruction -an unusual or allergic reaction to calcium, vitamin D, tartrazine dye, other medicines, foods, dyes, or preservatives -pregnant or trying to get pregnant -breast-feeding How should I use this medicine? Take this medicine by mouth. Chew it completely before swallowing. Follow the directions on the prescription label. Take with food or within 1 hour after a meal. Take your medicine at regular intervals. Do not take your medicine more often than directed. Talk to your pediatrician regarding the use of this medicine in children. While this medicine may be used in children for selected conditions, precautions do apply. Overdosage: If you think you have taken too much of this medicine contact a poison control center or emergency room at once. NOTE: This medicine is only for you. Do not share this medicine with others. What if I miss a dose? If you miss a dose, take it as soon as you can. If it is almost time for your next dose, take only that dose. Do not take double or extra doses. What may interact with this medicine? Do not take this medicine with any of the following  medications: -ammonium chloride -methenamine This medicine may also interact with the following medications: -antibiotics like ciprofloxacin, gatifloxacin, tetracycline -captopril -delavirdine -diuretics -gabapentin -iron supplements -medicines for fungal infections like ketoconazole and itraconazole -medicines for seizures like ethotoin and phenytoin -mineral oil -mycophenolate -other vitamins with calcium, vitamin D, or minerals -quinidine -rosuvastatin -sucralfate -thyroid medicine This list may not describe all possible interactions. Give your health care provider a list of all the medicines, herbs, non-prescription drugs, or dietary supplements you use. Also tell them if you smoke, drink alcohol, or use illegal drugs. Some items may interact with your medicine. What should I watch for while using this medicine? Taking this medicine is not a substitute for a well-balanced diet and exercise. Talk with your doctor or health care provider and follow a healthy lifestyle. Do not take this medicine with high-fiber foods, large amounts of alcohol, or drinks containing caffeine. Do not take this medicine within 2 hours of any other medicines. What side effects may I notice from receiving this medicine? Side effects that you should report to your doctor or health care professional as soon as possible: -allergic reactions like skin rash, itching or hives, swelling of the face, lips, or tongue -confusion -dry mouth -high blood pressure -increased hunger or thirst -increased urination -irregular heartbeat -metallic taste -muscle or bone pain -pain when urinating -seizure -unusually weak or tired -weight loss Side effects that usually do not require medical attention (report to your doctor or health care professional if they continue or are bothersome): -constipation -diarrhea -headache -loss of appetite -nausea, vomiting -stomach upset This list may not  describe all possible side  effects. Call your doctor for medical advice about side effects. You may report side effects to FDA at 1-800-FDA-1088. Where should I keep my medicine? Keep out of the reach of children. Store at room temperature between 15 and 30 degrees C (59 and 86 degrees F). Protect from light. Keep container tightly closed. Throw away any unused medicine after the expiration date. NOTE: This sheet is a summary. It may not cover all possible information. If you have questions about this medicine, talk to your doctor, pharmacist, or health care provider.  2018 Elsevier/Gold Standard (2007-12-11 17:57:27)

## 2017-12-24 LAB — CYTOLOGY - PAP
CHLAMYDIA, DNA PROBE: NEGATIVE
Diagnosis: NEGATIVE
NEISSERIA GONORRHEA: NEGATIVE

## 2018-02-13 ENCOUNTER — Encounter (HOSPITAL_COMMUNITY): Payer: Self-pay | Admitting: *Deleted

## 2018-02-13 ENCOUNTER — Other Ambulatory Visit: Payer: Self-pay

## 2018-02-13 ENCOUNTER — Emergency Department (HOSPITAL_COMMUNITY)
Admission: EM | Admit: 2018-02-13 | Discharge: 2018-02-14 | Disposition: A | Discharge status: Home / Self Care | Payer: Self-pay | Attending: Emergency Medicine | Admitting: Emergency Medicine

## 2018-02-13 DIAGNOSIS — S39012A Strain of muscle, fascia and tendon of lower back, initial encounter: Secondary | ICD-10-CM | POA: Insufficient documentation

## 2018-02-13 DIAGNOSIS — Y929 Unspecified place or not applicable: Secondary | ICD-10-CM | POA: Insufficient documentation

## 2018-02-13 DIAGNOSIS — Y999 Unspecified external cause status: Secondary | ICD-10-CM | POA: Insufficient documentation

## 2018-02-13 DIAGNOSIS — S43401A Unspecified sprain of right shoulder joint, initial encounter: Secondary | ICD-10-CM | POA: Insufficient documentation

## 2018-02-13 DIAGNOSIS — Y9389 Activity, other specified: Secondary | ICD-10-CM | POA: Insufficient documentation
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
## 2018-02-14 ENCOUNTER — Emergency Department (HOSPITAL_COMMUNITY): Payer: Self-pay

## 2018-02-14 LAB — POC URINE PREG, ED: PREG TEST UR: NEGATIVE

## 2018-02-14 MED ORDER — CYCLOBENZAPRINE HCL 10 MG PO TABS: 10.0000 mg | ORAL_TABLET | Freq: Two times a day (BID) | ORAL | 0 refills | Status: DC | PRN

## 2018-02-14 NOTE — ED Notes (Signed)
Pt called for vitals, no response. 
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
## 2018-02-14 NOTE — Discharge Instructions (Signed)
Ibuprofen 600 mg every 6 hours as needed for pain.  Flexeril as prescribed as needed for pain not relieved with ibuprofen.  Follow-up with your primary doctor if symptoms are not improving in the next 3 to 4 days.

## 2018-02-14 NOTE — ED Notes (Signed)
Patient transported to X-ray 

## 2018-02-14 NOTE — ED Notes (Signed)
Called pt for vitals x3, no response. 

## 2018-03-08 ENCOUNTER — Ambulatory Visit (INDEPENDENT_AMBULATORY_CARE_PROVIDER_SITE_OTHER): Payer: Medicaid Other | Admitting: *Deleted

## 2018-03-08 VITALS — BP 116/62 | HR 77

## 2018-03-08 DIAGNOSIS — Z3042 Encounter for surveillance of injectable contraceptive: Secondary | ICD-10-CM | POA: Diagnosis present

## 2018-03-08 MED ORDER — MEDROXYPROGESTERONE ACETATE 150 MG/ML IM SUSP
150.0000 mg | INTRAMUSCULAR | Status: DC
  Administered 2018-03-08: 150 mg via INTRAMUSCULAR

## 2018-04-15 ENCOUNTER — Encounter (HOSPITAL_COMMUNITY): Payer: Self-pay | Admitting: Emergency Medicine

## 2018-04-15 ENCOUNTER — Ambulatory Visit (INDEPENDENT_AMBULATORY_CARE_PROVIDER_SITE_OTHER): Payer: Self-pay

## 2018-04-15 ENCOUNTER — Ambulatory Visit (HOSPITAL_COMMUNITY)
Admission: EM | Admit: 2018-04-15 | Discharge: 2018-04-15 | Disposition: A | Discharge status: Home / Self Care | Payer: Self-pay

## 2018-04-15 DIAGNOSIS — M79601 Pain in right arm: Secondary | ICD-10-CM

## 2018-04-15 DIAGNOSIS — R200 Anesthesia of skin: Secondary | ICD-10-CM

## 2018-04-15 DIAGNOSIS — M5412 Radiculopathy, cervical region: Secondary | ICD-10-CM

## 2018-04-15 DIAGNOSIS — R202 Paresthesia of skin: Secondary | ICD-10-CM

## 2018-04-15 MED ORDER — CYCLOBENZAPRINE HCL 5 MG PO TABS: 5.0000 mg | ORAL_TABLET | Freq: Every evening | ORAL | 0 refills | Status: DC | PRN

## 2018-04-15 MED ORDER — PREDNISONE 20 MG PO TABS: ORAL_TABLET | ORAL | 0 refills | Status: DC

## 2018-04-15 NOTE — Discharge Instructions (Signed)
Make sure that you are hydrating well every day with at least 2L of water daily.  We might need to consider physical therapy if your symptoms do not improve but as we are in urgent care clinic we cannot do referrals.  I do [facility], if you continue to have symptoms.

## 2018-04-15 NOTE — ED Triage Notes (Signed)
PT reports shooting pain in right arm and numbness in third and fourth fingers for 1 week.
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
## 2018-05-24 ENCOUNTER — Ambulatory Visit (INDEPENDENT_AMBULATORY_CARE_PROVIDER_SITE_OTHER): Payer: Medicaid Other

## 2018-05-24 DIAGNOSIS — Z3042 Encounter for surveillance of injectable contraceptive: Secondary | ICD-10-CM

## 2018-05-24 MED ORDER — MEDROXYPROGESTERONE ACETATE 150 MG/ML IM SUSP
150.0000 mg | Freq: Once | INTRAMUSCULAR | Status: AC
  Administered 2018-05-24: 150 mg via INTRAMUSCULAR

## 2018-05-24 NOTE — Progress Notes (Signed)
Gina Montoya here for Depo-Provera  Injection.  Injection administered without complication. Patient will return in 3 months for next injection.  Janene Madeiraicole S Maryssa Giampietro, CMA 05/24/2018  1:55 PM

## 2018-08-12 ENCOUNTER — Ambulatory Visit (INDEPENDENT_AMBULATORY_CARE_PROVIDER_SITE_OTHER): Payer: Medicaid Other | Admitting: *Deleted

## 2018-08-12 VITALS — BP 106/71 | HR 89

## 2018-08-12 DIAGNOSIS — Z3042 Encounter for surveillance of injectable contraceptive: Secondary | ICD-10-CM | POA: Diagnosis not present

## 2018-08-12 MED ORDER — MEDROXYPROGESTERONE ACETATE 150 MG/ML IM SUSP
150.0000 mg | Freq: Once | INTRAMUSCULAR | Status: AC
Start: 2018-08-12 — End: 2018-08-12
  Administered 2018-08-12: 150 mg via INTRAMUSCULAR

## 2018-08-12 NOTE — Progress Notes (Signed)
Lilia ProKhasha Nettles-Bain here for Depo-Provera  Injection.  Injection administered without complication. Patient will return in 3 months for next injection.  Elaijah Munoz,RN 08/12/2018  1:52 PM

## 2018-08-13 NOTE — Progress Notes (Signed)
I have reviewed this chart and agree with the RN/CMA assessment and management.    Colston Pyle C Finn Amos, MD, FACOG Attending Physician, [facility]  
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
## 2018-09-23 ENCOUNTER — Ambulatory Visit: Payer: Self-pay | Admitting: Obstetrics & Gynecology

## 2018-09-24 ENCOUNTER — Ambulatory Visit: Payer: Self-pay | Admitting: Obstetrics & Gynecology

## 2018-10-28 ENCOUNTER — Ambulatory Visit: Payer: Self-pay

## 2018-10-31 ENCOUNTER — Encounter: Payer: Self-pay | Admitting: Obstetrics & Gynecology

## 2018-10-31 ENCOUNTER — Ambulatory Visit (INDEPENDENT_AMBULATORY_CARE_PROVIDER_SITE_OTHER): Payer: Medicaid Other | Admitting: Obstetrics & Gynecology

## 2018-10-31 VITALS — BP 114/75 | HR 93 | Ht 62.0 in | Wt 141.0 lb

## 2018-10-31 DIAGNOSIS — Z3042 Encounter for surveillance of injectable contraceptive: Secondary | ICD-10-CM

## 2018-10-31 DIAGNOSIS — R102 Pelvic and perineal pain: Secondary | ICD-10-CM | POA: Diagnosis not present

## 2018-10-31 IMAGING — DX DG CERVICAL SPINE COMPLETE 4+V
4 series · 4 of 4 positions shown · non-contrast
Comparison: None.

CLINICAL DATA: Numbness starts in fingers. Extends to the RIGHT
neck.

EXAM:
CERVICAL SPINE - COMPLETE 4+ VIEW

[c-spine lat]
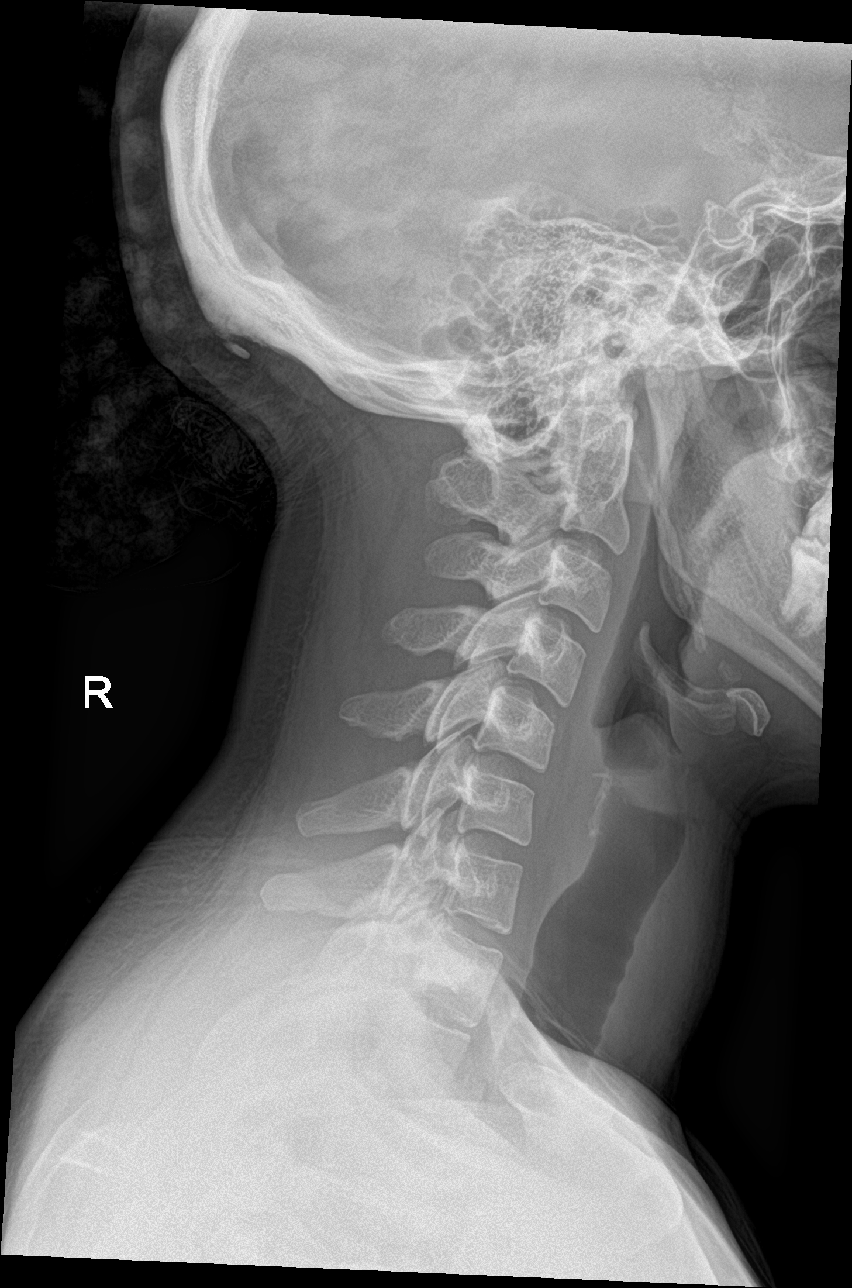

[c-spine obl (1 of 2)]
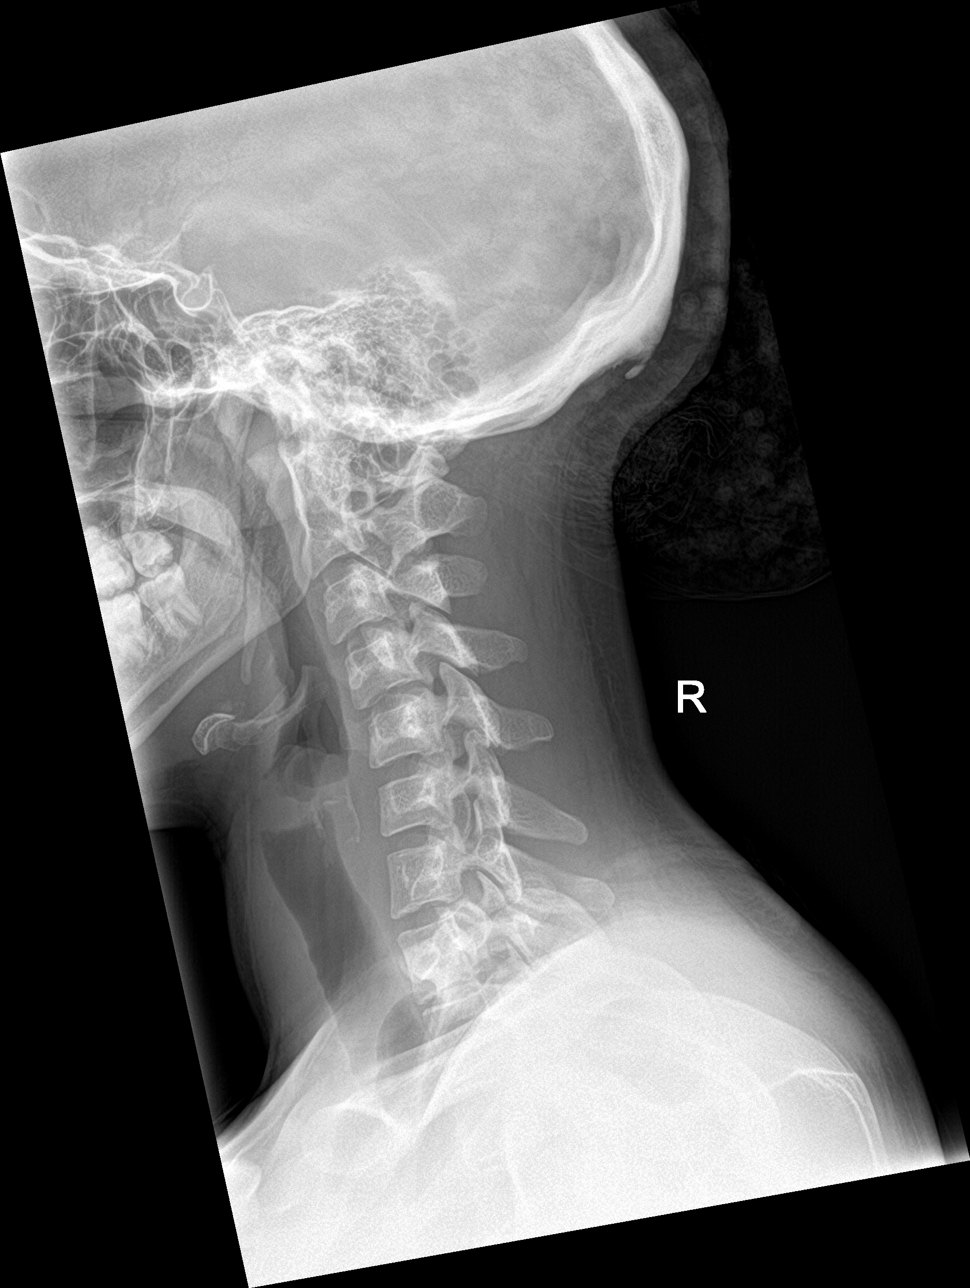

[c-spine obl (2 of 2)]
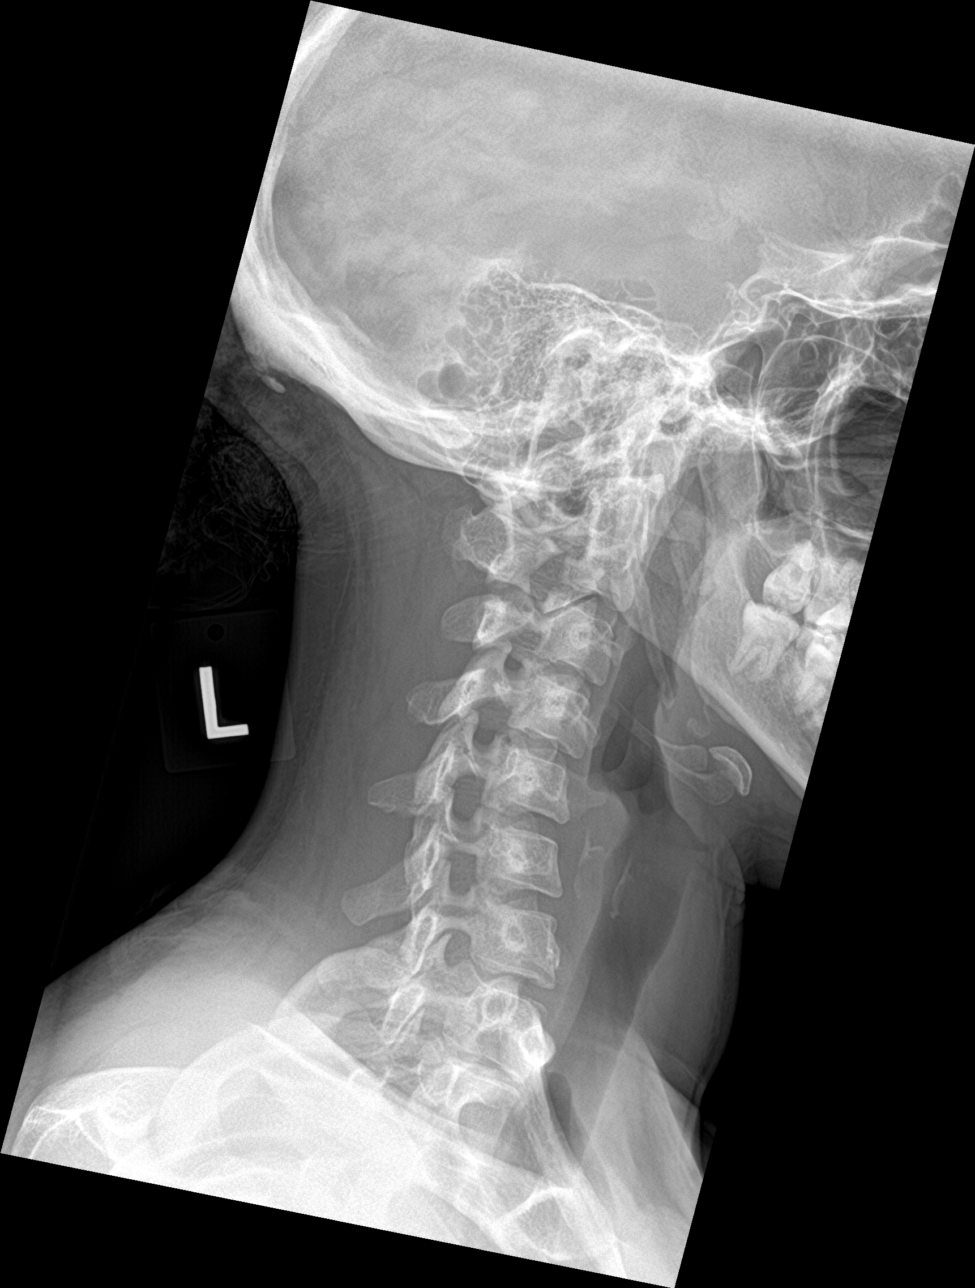

[c-spine ap]
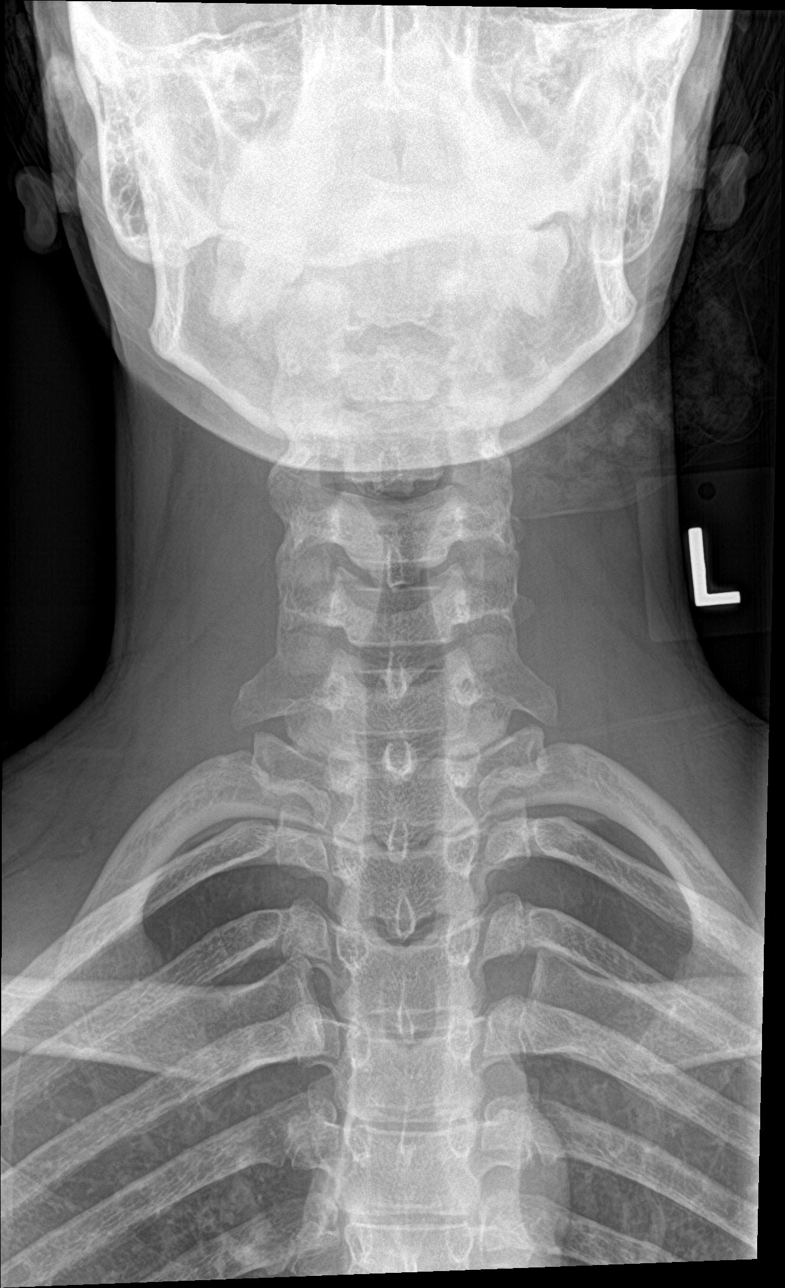

[4 of 4 positions shown; findings below may reference images not displayed]

FINDINGS: There is reversal of the normal cervical lordotic curve, Gadari
deformity centered at C5-C6. No foraminal narrowing. No fracture or
osseous abnormality. Intervertebral disc heights are maintained.
Rudimentary cervical ribs, otherwise clear lung apices. Odontoid not
separately imaged.
IMPRESSION: Reversal of the normal cervical lordotic curve without fracture or
foraminal narrowing.

## 2018-10-31 MED ORDER — MEDROXYPROGESTERONE ACETATE 150 MG/ML IM SUSP
150.0000 mg | Freq: Once | INTRAMUSCULAR | Status: AC
  Administered 2018-10-31: 150 mg via INTRAMUSCULAR
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
## 2018-10-31 NOTE — Progress Notes (Signed)
History:  28 y.o. [id number] here today for annual GYN exam. Pt was notified that she is not due for an annual or a PAP.  She reports intermittent pressure in her pelivs. She also reports that she has intermittent constipiation.   The following portions of the patient's history were reviewed and updated as appropriate: allergies, current medications, past family history, past medical history, past social history, past surgical history and problem list.  Review of Systems:  Pertinent items are noted in HPI.    Objective:  Physical Exam Blood pressure 114/75, pulse 93, height 5\' 2"  (1.575 m), weight 141 lb (64 kg), not currently breastfeeding.  CONSTITUTIONAL: Well-developed, well-nourished female in no acute distress.  HENT:  Normocephalic, atraumatic EYES: Conjunctivae and EOM are normal. No scleral icterus.  NECK: Normal range of motion SKIN: Skin is warm and dry. No rash noted. Not diaphoretic.No pallor. NEUROLGIC: Alert and oriented to person, place, and time. Normal coordination.  Abd: Soft, nontender and nondistended; umbilical hernia- nonincarcerated.  Pelvic: Normal appearing external genitalia; Small uterus, no other palpable masses, no uterine or adnexal tenderness. SMALL fibroid palpated on the post surface of the uterus.    Assessment & Plan:  Pelvic pressure  No GYN etiology identified.   rec establishment of primary care provider.   Contraception management   Pt is s/p Depo Provera 150mg  IM today  F/u in 3 months for annual and Depo Provera  Total face-to-face time with patient was 15 min.  Greater than 50% was spent in counseling and coordination of care with the patient.   Olisa Quesnel L. Harraway-Smith, M.D., [place]

## 2018-10-31 NOTE — Patient Instructions (Signed)
Pelvic Pain, Female Pelvic pain is pain in your lower abdomen, below your belly button and between your hips. The pain may start suddenly (be acute), keep coming back (be recurring), or last a long time (become chronic). Pelvic pain that lasts longer than 6 months is considered chronic. Pelvic pain may affect your:  Reproductive organs.  Urinary system.  Digestive tract.  Musculoskeletal system. There are many potential causes of pelvic pain. Sometimes, the pain can be a result of digestive or urinary conditions, strained muscles or ligaments, or reproductive conditions. Sometimes the cause of pelvic pain is not known. Follow these instructions at home:   Take over-the-counter and prescription medicines only as told by your health care provider.  Rest as told by your health care provider.  Do not have sex if it hurts.  Keep a journal of your pelvic pain. Write down: ? When the pain started. ? Where the pain is located. ? What seems to make the pain better or worse, such as food or your period (menstrual cycle). ? Any symptoms you have along with the pain.  Keep all follow-up visits as told by your health care provider. This is important. Contact a health care provider if:  Medicine does not help your pain.  Your pain comes back.  You have new symptoms.  You have abnormal vaginal discharge or bleeding, including bleeding after menopause.  You have a fever or chills.  You are constipated.  You have blood in your urine or stool.  You have foul-smelling urine.  You feel weak or light-headed. Get help right away if:  You have sudden severe pain.  Your pain gets steadily worse.  You have severe pain along with fever, nausea, vomiting, or excessive sweating.  You lose consciousness. Summary  Pelvic pain is pain in your lower abdomen, below your belly button and between your hips.  There are many potential causes of pelvic pain.  Keep a journal of your pelvic  pain. This information is not intended to replace advice given to you by your health care provider. Make sure you discuss any questions you have with your health care provider. Document Released: 07/25/2004 Document Revised: 02/13/2018 Document Reviewed: 02/13/2018 Elsevier Interactive Patient Education  2019 Elsevier Inc.  

## 2018-11-12 DIAGNOSIS — F411 Generalized anxiety disorder: Secondary | ICD-10-CM | POA: Diagnosis not present

## 2018-11-21 ENCOUNTER — Ambulatory Visit (INDEPENDENT_AMBULATORY_CARE_PROVIDER_SITE_OTHER): Payer: Self-pay | Admitting: Primary Care

## 2018-12-04 DIAGNOSIS — F411 Generalized anxiety disorder: Secondary | ICD-10-CM | POA: Diagnosis not present

## 2018-12-06 ENCOUNTER — Ambulatory Visit (INDEPENDENT_AMBULATORY_CARE_PROVIDER_SITE_OTHER): Payer: Self-pay | Admitting: Primary Care

## 2018-12-12 DIAGNOSIS — F411 Generalized anxiety disorder: Secondary | ICD-10-CM | POA: Diagnosis not present

## 2019-01-31 ENCOUNTER — Other Ambulatory Visit: Payer: Self-pay

## 2019-01-31 ENCOUNTER — Ambulatory Visit (INDEPENDENT_AMBULATORY_CARE_PROVIDER_SITE_OTHER): Payer: Medicaid Other

## 2019-01-31 DIAGNOSIS — Z3042 Encounter for surveillance of injectable contraceptive: Secondary | ICD-10-CM | POA: Diagnosis not present

## 2019-01-31 MED ORDER — MEDROXYPROGESTERONE ACETATE 150 MG/ML IM SUSP
150.0000 mg | Freq: Once | INTRAMUSCULAR | Status: AC
  Administered 2019-01-31: 11:00:00 150 mg via INTRAMUSCULAR

## 2019-01-31 NOTE — Progress Notes (Signed)
I have reviewed the chart and agree with nursing staff's documentation of this patient's encounter.  Jaynie Collins, MD 01/31/2019 1:27 PM

## 2019-01-31 NOTE — Progress Notes (Signed)
Gina Montoya here for Depo-Provera  Injection.  Injection administered without complication. Patient will return in 3 months for next injection.   Ed Blalock, RN 01/31/2019  11:14 AM

## 2019-04-15 DIAGNOSIS — F411 Generalized anxiety disorder: Secondary | ICD-10-CM | POA: Diagnosis not present

## 2019-04-18 ENCOUNTER — Ambulatory Visit: Payer: Medicaid Other

## 2019-04-23 DIAGNOSIS — F411 Generalized anxiety disorder: Secondary | ICD-10-CM | POA: Diagnosis not present

## 2019-04-30 DIAGNOSIS — F411 Generalized anxiety disorder: Secondary | ICD-10-CM | POA: Diagnosis not present

## 2019-05-07 DIAGNOSIS — F411 Generalized anxiety disorder: Secondary | ICD-10-CM | POA: Diagnosis not present

## 2019-05-28 DIAGNOSIS — F411 Generalized anxiety disorder: Secondary | ICD-10-CM | POA: Diagnosis not present

## 2019-06-04 DIAGNOSIS — F411 Generalized anxiety disorder: Secondary | ICD-10-CM | POA: Diagnosis not present

## 2019-06-18 DIAGNOSIS — F411 Generalized anxiety disorder: Secondary | ICD-10-CM | POA: Diagnosis not present

## 2019-11-16 ENCOUNTER — Encounter (HOSPITAL_COMMUNITY): Payer: Self-pay | Admitting: Family Medicine

## 2019-11-16 ENCOUNTER — Other Ambulatory Visit: Payer: Self-pay

## 2019-11-16 ENCOUNTER — Inpatient Hospital Stay (HOSPITAL_COMMUNITY)
Admission: AD | Admit: 2019-11-16 | Discharge: 2019-11-16 | Disposition: A | Discharge status: Home / Self Care | Payer: Medicaid Other | Attending: Family Medicine | Admitting: Family Medicine

## 2019-11-16 ENCOUNTER — Inpatient Hospital Stay (HOSPITAL_COMMUNITY): Payer: Medicaid Other

## 2019-11-16 DIAGNOSIS — O26891 Other specified pregnancy related conditions, first trimester: Secondary | ICD-10-CM | POA: Diagnosis not present

## 2019-11-16 DIAGNOSIS — R109 Unspecified abdominal pain: Secondary | ICD-10-CM | POA: Diagnosis not present

## 2019-11-16 DIAGNOSIS — Z3A01 Less than 8 weeks gestation of pregnancy: Secondary | ICD-10-CM | POA: Diagnosis not present

## 2019-11-16 DIAGNOSIS — Z3491 Encounter for supervision of normal pregnancy, unspecified, first trimester: Secondary | ICD-10-CM

## 2019-11-16 DIAGNOSIS — R1030 Lower abdominal pain, unspecified: Secondary | ICD-10-CM | POA: Insufficient documentation

## 2019-11-16 LAB — URINALYSIS, ROUTINE W REFLEX MICROSCOPIC
Bilirubin Urine: NEGATIVE
Glucose, UA: NEGATIVE mg/dL
Hgb urine dipstick: NEGATIVE
Ketones, ur: 20 mg/dL — AB
Leukocytes,Ua: NEGATIVE
Nitrite: NEGATIVE
Protein, ur: NEGATIVE mg/dL
Specific Gravity, Urine: 1.023 (ref 1.005–1.030)
pH: 7 (ref 5.0–8.0)

## 2019-11-16 LAB — WET PREP, GENITAL
Clue Cells Wet Prep HPF POC: NONE SEEN
Sperm: NONE SEEN
Trich, Wet Prep: NONE SEEN
Yeast Wet Prep HPF POC: NONE SEEN

## 2019-11-16 LAB — CBC
HCT: 44.2 % (ref 36.0–46.0)
Hemoglobin: 14.9 g/dL (ref 12.0–15.0)
MCH: 30 pg (ref 26.0–34.0)
MCHC: 33.7 g/dL (ref 30.0–36.0)
MCV: 89.1 fL (ref 80.0–100.0)
Platelets: 267 10*3/uL (ref 150–400)
RBC: 4.96 MIL/uL (ref 3.87–5.11)
RDW: 14.2 % (ref 11.5–15.5)
WBC: 8.5 10*3/uL (ref 4.0–10.5)
nRBC: 0 % (ref 0.0–0.2)

## 2019-11-16 LAB — POCT PREGNANCY, URINE: Preg Test, Ur: POSITIVE — AB

## 2019-11-16 LAB — HIV ANTIBODY (ROUTINE TESTING W REFLEX): HIV Screen 4th Generation wRfx: NONREACTIVE

## 2019-11-16 LAB — HCG, QUANTITATIVE, PREGNANCY: hCG, Beta Chain, Quant, S: 62063 m[IU]/mL — ABNORMAL HIGH (ref <5)

## 2019-11-16 NOTE — MAU Provider Note (Signed)
Chief Complaint: Abdominal Pain   First Provider Initiated Contact with Patient 11/16/19 1341      SUBJECTIVE HPI: Gina Montoya is a 29 y.o. G3P2002 at [redacted]w[redacted]d by sure LMP who presents to maternity admissions reporting pain in the left lower abdomen x several days, worsening last night.  The pain is cramping, LLQ, intermittent pain that has worsened.  It does not radiate.  She has not tried any treatments.  There are no associated symptoms.   She denies vaginal bleeding, vaginal itching/burning, urinary symptoms, h/a, dizziness, n/v, or fever/chills.     HPI  Past Medical History:  Diagnosis Date  . Medical history non-contributory    Past Surgical History:  Procedure Laterality Date  . CESAREAN SECTION N/A 08/28/2017   Procedure: CESAREAN SECTION;  Surgeon: Levie Heritage, DO;  Location: [facility] BIRTHING SUITES;  Service: Obstetrics;  Laterality: N/A;  . NO PAST SURGERIES     Social History   Socioeconomic History  . Marital status: Single    Spouse name: Not on file  . Number of children: Not on file  . Years of education: Not on file  . Highest education level: Not on file  Occupational History  . Not on file  Tobacco Use  . Smoking status: Never Smoker  . Smokeless tobacco: Never Used  Substance and Sexual Activity  . Alcohol use: No  . Drug use: No  . Sexual activity: Yes    Birth control/protection: None  Other Topics Concern  . Not on file  Social History Narrative  . Not on file   Social Determinants of Health   Financial Resource Strain:   . Difficulty of Paying Living Expenses: Not on file  Food Insecurity:   . Worried About Programme researcher, broadcasting/film/video in the Last Year: Not on file  . Ran Out of Food in the Last Year: Not on file  Transportation Needs:   . Lack of Transportation (Medical): Not on file  . Lack of Transportation (Non-Medical): Not on file  Physical Activity:   . Days of Exercise per Week: Not on file  . Minutes of Exercise per Session: Not  on file  Stress:   . Feeling of Stress : Not on file  Social Connections:   . Frequency of Communication with Friends and Family: Not on file  . Frequency of Social Gatherings with Friends and Family: Not on file  . Attends Religious Services: Not on file  . Active Member of Clubs or Organizations: Not on file  . Attends Banker Meetings: Not on file  . Marital Status: Not on file  Intimate Partner Violence:   . Fear of Current or Ex-Partner: Not on file  . Emotionally Abused: Not on file  . Physically Abused: Not on file  . Sexually Abused: Not on file   No current facility-administered medications on file prior to encounter.   Current Outpatient Medications on File Prior to Encounter  Medication Sig Dispense Refill  . Multiple Vitamins-Calcium (ONE-A-DAY WOMENS FORMULA PO) Take by mouth.     No Known Allergies  ROS:  Review of Systems  Constitutional: Negative for chills, fatigue and fever.  Respiratory: Negative for cough and shortness of breath.   Cardiovascular: Negative for chest pain.  Gastrointestinal: Positive for abdominal pain. Negative for nausea and vomiting.  Genitourinary: Negative for difficulty urinating, dysuria, flank pain, frequency, pelvic pain, urgency, vaginal bleeding, vaginal discharge and vaginal pain.  Musculoskeletal: Negative.   Neurological: Negative for dizziness and headaches.  Psychiatric/Behavioral: Negative.      I have reviewed patient's Past Medical Hx, Surgical Hx, Family Hx, Social Hx, medications and allergies.   Physical Exam   Patient Vitals for the past 24 hrs:  BP Temp Pulse Resp SpO2 Weight  11/16/19 1224 115/62 97.6 F (36.4 C) 89 16 100 % 59 kg   Constitutional: Well-developed, well-nourished female in no acute distress.  Cardiovascular: normal rate Respiratory: normal effort GI: Abd soft, non-tender. Pos BS x 4 MS: Extremities nontender, no edema, normal ROM Neurologic: Alert and oriented x 4.  GU: Neg  CVAT.  PELVIC EXAM: Wet prep/GCC collected by blind swab   LAB RESULTS Results for orders placed or performed during the hospital encounter of 11/16/19 (from the past 24 hour(s))  Urinalysis, Routine w reflex microscopic     Status: Abnormal   Collection Time: 11/16/19 12:27 PM  Result Value Ref Range   Color, Urine YELLOW YELLOW   APPearance CLEAR CLEAR   Specific Gravity, Urine 1.023 1.005 - 1.030   pH 7.0 5.0 - 8.0   Glucose, UA NEGATIVE NEGATIVE mg/dL   Hgb urine dipstick NEGATIVE NEGATIVE   Bilirubin Urine NEGATIVE NEGATIVE   Ketones, ur 20 (A) NEGATIVE mg/dL   Protein, ur NEGATIVE NEGATIVE mg/dL   Nitrite NEGATIVE NEGATIVE   Leukocytes,Ua NEGATIVE NEGATIVE  Pregnancy, urine POC     Status: Abnormal   Collection Time: 11/16/19 12:32 PM  Result Value Ref Range   Preg Test, Ur POSITIVE (A) NEGATIVE  CBC     Status: None   Collection Time: 11/16/19  1:14 PM  Result Value Ref Range   WBC 8.5 4.0 - 10.5 K/uL   RBC 4.96 3.87 - 5.11 MIL/uL   Hemoglobin 14.9 12.0 - 15.0 g/dL   HCT 44.2 36.0 - 46.0 %   MCV 89.1 80.0 - 100.0 fL   MCH 30.0 26.0 - 34.0 pg   MCHC 33.7 30.0 - 36.0 g/dL   RDW 14.2 11.5 - 15.5 %   Platelets 267 150 - 400 K/uL   nRBC 0.0 0.0 - 0.2 %  hCG, quantitative, pregnancy     Status: Abnormal   Collection Time: 11/16/19  1:14 PM  Result Value Ref Range   hCG, Beta Chain, Quant, S 62,063 (H) <5 mIU/mL  HIV Antibody (routine testing w rflx)     Status: None   Collection Time: 11/16/19  1:14 PM  Result Value Ref Range   HIV Screen 4th Generation wRfx NON REACTIVE NON REACTIVE  Wet prep, genital     Status: Abnormal   Collection Time: 11/16/19  1:19 PM   Specimen: Vaginal  Result Value Ref Range   Yeast Wet Prep HPF POC NONE SEEN NONE SEEN   Trich, Wet Prep NONE SEEN NONE SEEN   Clue Cells Wet Prep HPF POC NONE SEEN NONE SEEN   WBC, Wet Prep HPF POC FEW (A) NONE SEEN   Sperm NONE SEEN        IMAGING US OB LESS THAN 14 WEEKS WITH OB  TRANSVAGINAL  Result Date: 11/16/2019 CLINICAL DATA:  Abdominal pain in 1st trimester pregnancy. EXAM: OBSTETRIC <14 WK [place] AND TRANSVAGINAL OB US TECHNIQUE: Both transabdominal and transvaginal ultrasound examinations were performed for complete evaluation of the gestation as well as the maternal uterus, adnexal regions, and pelvic cul-de-sac. Transvaginal technique was performed to assess early pregnancy. COMPARISON:  None. FINDINGS: Intrauterine gestational sac: Single Yolk sac:  Visualized. Embryo:  Visualized. Cardiac Activity: Visualized. Heart Rate: 122 bpm CRL:  6.5 mm   6 w   3 d                  [place] EDC: 07/08/2020 Subchorionic hemorrhage:  None visualized. Maternal uterus/adnexae: Both ovaries are normal in appearance. No mass or abnormal free fluid identified. IMPRESSION: Single living IUP with estimated gestational age of [redacted] weeks 3 days, and [place] EDC of 07/08/2020. No maternal uterine or adnexal abnormality identified. Electronically Signed   By: Danae Orleans M.D.   On: 11/16/2019 14:46    MAU Management/MDM: Orders Placed This Encounter  Procedures  . Wet prep, genital  . US OB LESS THAN 14 WEEKS WITH OB TRANSVAGINAL  . Urinalysis, Routine w reflex microscopic  . CBC  . hCG, quantitative, pregnancy  . HIV Antibody (routine testing w rflx)  . Pregnancy, urine POC  . Discharge patient    No orders of the defined types were placed in this encounter.   IUP noted on today's ultrasound, UA normal, wet prep normal. Pain may be musculoskeletal or digestive.  Rest, heat, ice, PO Tylenol for pain.  Increase PO fluids.  Follow up with prenatal care as planned.  Return to MAU as needed for emergencies.   ASSESSMENT 1. Normal IUP (intrauterine pregnancy) on prenatal ultrasound, first trimester   2. Abdominal pain in pregnancy, first trimester     PLAN Discharge home Allergies as of 11/16/2019   No Known Allergies     Medication List    TAKE these medications   ONE-A-DAY WOMENS FORMULA  PO Take by mouth.      Follow-up Information    Prenatal Provider of your choice Follow up.   Why: See list provided, return to MAU as needed for emergencies          Sharen Counter Certified Nurse-Midwife 11/16/2019  2:51 PM

## 2019-11-16 NOTE — Discharge Instructions (Signed)
[facility] [employer] for [employer] at [employer]: [phone]  [facility] for [employer] at [place]   Phone: [phone]  [facility] for [employer] at [place]  Phone: [phone]  [facility] [employer] at [place]                  Phone: [phone]  [facility] for [employer] at [employer]  Phone: [phone]  [facility] [employer] at [place]  Phone: [phone]  [facility]   Phone: [phone]  [facility]       Phone: [phone]  [facility]    Phone: [phone]   [facility]    Phone: [phone]  [facility]    Phone: [phone]  [facility]    Phone: [phone]  [facility]       Phone: [phone]   [facility] Department-Maternity  Phone: [phone]  [facility]    Phone: [phone]  [facility] [place]   Phone: [phone]  [facility]      Phone: [phone]  [facility]    Phone: [phone]  Safe Medications in Pregnancy   Acne: Benzoyl Peroxide Salicylic Acid  Backache/Headache: Tylenol: 2 regular strength every 4 hours OR              2 Extra strength every 6 hours  Colds/Coughs/Allergies: Benadryl (alcohol free) 25 mg every 6 hours as needed Breath right strips Claritin Cepacol throat lozenges Chloraseptic throat spray Cold-Eeze- up to three times per day Cough drops, alcohol free Flonase (by prescription only) Guaifenesin Mucinex Robitussin DM (plain only, alcohol free) Saline nasal spray/drops Sudafed (pseudoephedrine) & Actifed ** use only after [redacted] weeks gestation and if you do not have high blood pressure Tylenol Vicks Vaporub Zinc lozenges Zyrtec    Constipation: Colace Ducolax suppositories Fleet enema Glycerin suppositories Metamucil Milk of magnesia Miralax Senokot Smooth move tea  Diarrhea: Kaopectate Imodium A-D  *NO pepto Bismol  Hemorrhoids: Anusol Anusol HC Preparation H Tucks  Indigestion: Tums Maalox Mylanta Zantac  Pepcid  Insomnia: Benadryl (alcohol free) 25mg  every 6 hours as needed Tylenol PM Unisom, no Gelcaps  Leg Cramps: Tums MagGel  Nausea/Vomiting:  Bonine Dramamine Emetrol Ginger extract Sea bands Meclizine  Nausea medication to take during pregnancy:  Unisom (doxylamine succinate 25 mg tablets) Take one tablet daily at bedtime. If symptoms are not adequately controlled, the dose can be increased to a maximum recommended dose of two tablets daily (1/2 tablet in the morning, 1/2 tablet mid-afternoon and one at bedtime). Vitamin B6 100mg  tablets. Take one tablet twice a day (up to 200 mg per day).  Skin Rashes: Aveeno products Benadryl cream or 25mg  every 6 hours as needed Calamine Lotion 1% cortisone cream  Yeast infection: Gyne-lotrimin 7 Monistat 7   **If taking multiple medications, please check labels to avoid duplicating the same active ingredients **take medication as directed on the label ** Do not exceed 4000 mg of tylenol in 24 hours **Do not take medications that contain aspirin or ibuprofen

## 2019-11-16 NOTE — MAU Note (Signed)
Gina Montoya is a 29 y.o. at Unknown here in MAU reporting:  +lower left abdominal pain. Intermittent.  "usually worse in the mornings when I wake up".  Pain intensified over night. LMP: around January 20th Endorses 3 +HPT Pain score: 9/10 Has not taken anything for the pain as she states she was unsure what she could take.  States she has noticed an increase in "white milky" discharge.  Vitals:   11/16/19 1224  BP: 115/62  Pulse: 89  Resp: 16  Temp: 97.6 F (36.4 C)  SpO2: 100%      Lab orders placed from triage: ua and pregnancy test

## 2019-11-18 LAB — GC/CHLAMYDIA PROBE AMP (~~LOC~~) NOT AT ARMC
Chlamydia: NEGATIVE
Comment: NEGATIVE
Comment: NORMAL
Neisseria Gonorrhea: NEGATIVE

## 2019-12-30 ENCOUNTER — Telehealth: Payer: Self-pay | Admitting: Student

## 2019-12-30 NOTE — Telephone Encounter (Signed)
Called patient and left a voicemail message about changes made in the office that affected her appointment.

## 2020-01-05 ENCOUNTER — Other Ambulatory Visit: Payer: Self-pay

## 2020-01-05 ENCOUNTER — Encounter (HOSPITAL_COMMUNITY): Payer: Self-pay | Admitting: Emergency Medicine

## 2020-01-05 ENCOUNTER — Emergency Department (HOSPITAL_COMMUNITY)
Admission: EM | Admit: 2020-01-05 | Discharge: 2020-01-05 | Disposition: A | Discharge status: Home / Self Care | Payer: Medicaid Other | Attending: Emergency Medicine | Admitting: Emergency Medicine

## 2020-01-05 DIAGNOSIS — Z5321 Procedure and treatment not carried out due to patient leaving prior to being seen by health care provider: Secondary | ICD-10-CM | POA: Insufficient documentation

## 2020-01-05 DIAGNOSIS — H6092 Unspecified otitis externa, left ear: Secondary | ICD-10-CM | POA: Diagnosis not present

## 2020-01-05 DIAGNOSIS — H938X3 Other specified disorders of ear, bilateral: Secondary | ICD-10-CM | POA: Diagnosis not present

## 2020-01-05 DIAGNOSIS — H6123 Impacted cerumen, bilateral: Secondary | ICD-10-CM | POA: Diagnosis not present

## 2020-01-05 NOTE — ED Triage Notes (Signed)
Per pt, states left ear clogged for months-now right ear with same problem-states she thinks she has wax build up

## 2020-01-12 ENCOUNTER — Ambulatory Visit: Payer: Medicaid Other | Admitting: Student

## 2020-01-13 ENCOUNTER — Ambulatory Visit: Payer: Medicaid Other | Admitting: Student

## 2020-06-07 DIAGNOSIS — Z3480 Encounter for supervision of other normal pregnancy, unspecified trimester: Secondary | ICD-10-CM | POA: Insufficient documentation

## 2020-06-08 ENCOUNTER — Ambulatory Visit (INDEPENDENT_AMBULATORY_CARE_PROVIDER_SITE_OTHER): Payer: Medicaid Other

## 2020-06-08 DIAGNOSIS — Z3480 Encounter for supervision of other normal pregnancy, unspecified trimester: Secondary | ICD-10-CM

## 2020-06-08 MED ORDER — BLOOD PRESSURE KIT DEVI: 1.0000 | 0 refills | Status: DC

## 2020-06-08 NOTE — Progress Notes (Signed)
I have reviewed the chart and agree with the nurse's note and plan of care for this visit.   Emarion Toral Leftwich-Kirby, CNM 5:34 PM  

## 2020-06-08 NOTE — Progress Notes (Signed)
I connected with  Gina Montoya on 06/08/20 by a video enabled telemedicine application and verified that I am speaking with the correct person using two identifiers.  Patient is at home and I am at [facility]   I discussed the limitations of evaluation and management by telemedicine. The patient expressed understanding and agreed to proceed.  PRENATAL INTAKE SUMMARY  Gina Montoya presents today New OB Nurse Interview.  OB History    Gravida  4   Para  2   Term  2   Preterm      AB  1   Living  2     SAB      TAB      Ectopic      Multiple  1   Live Births  2          I have reviewed the patient's medical, obstetrical, social, and family histories, medications, and available lab results.  SUBJECTIVE She has no unusual complaints  OBJECTIVE Initial Nurse Intake (New OB)  GENERAL APPEARANCE: oriented to person, place and time   ASSESSMENT Normal pregnancy PHQ-9=4  PLAN Prenatal care at [facility] All OB Pnl/HIV will be done at New OB visit BP Cuff ordered BabyScripts ordered Pregnancy Risk Screening done

## 2020-06-13 DIAGNOSIS — O900 Disruption of cesarean delivery wound: Secondary | ICD-10-CM | POA: Diagnosis not present

## 2020-06-15 ENCOUNTER — Ambulatory Visit (INDEPENDENT_AMBULATORY_CARE_PROVIDER_SITE_OTHER): Payer: Medicaid Other | Admitting: Advanced Practice Midwife

## 2020-06-15 ENCOUNTER — Encounter: Payer: Self-pay | Admitting: Advanced Practice Midwife

## 2020-06-15 ENCOUNTER — Other Ambulatory Visit: Payer: Self-pay

## 2020-06-15 VITALS — BP 96/67 | HR 70 | Wt 123.0 lb

## 2020-06-15 DIAGNOSIS — O0992 Supervision of high risk pregnancy, unspecified, second trimester: Secondary | ICD-10-CM

## 2020-06-15 DIAGNOSIS — Z3480 Encounter for supervision of other normal pregnancy, unspecified trimester: Secondary | ICD-10-CM | POA: Diagnosis not present

## 2020-06-15 DIAGNOSIS — Z98891 History of uterine scar from previous surgery: Secondary | ICD-10-CM | POA: Diagnosis not present

## 2020-06-15 DIAGNOSIS — Z3A17 17 weeks gestation of pregnancy: Secondary | ICD-10-CM

## 2020-06-15 DIAGNOSIS — O09299 Supervision of pregnancy with other poor reproductive or obstetric history, unspecified trimester: Secondary | ICD-10-CM | POA: Diagnosis not present

## 2020-06-15 DIAGNOSIS — O0990 Supervision of high risk pregnancy, unspecified, unspecified trimester: Secondary | ICD-10-CM

## 2020-06-15 DIAGNOSIS — Z3A19 19 weeks gestation of pregnancy: Secondary | ICD-10-CM

## 2020-06-15 MED ORDER — ASPIRIN EC 81 MG PO TBEC: 81.0000 mg | DELAYED_RELEASE_TABLET | Freq: Every day | ORAL | 11 refills | Status: DC

## 2020-06-15 NOTE — Progress Notes (Signed)
Subjective:   Gina Montoya is a 29 y.o. V6H2094 at 57w3dby LMP being seen today for her first obstetrical visit.  Her obstetrical history is significant for vaginal delivery x 1, C/S x 1 and FGR  and has Post partum depression; Supervision of other normal pregnancy, antepartum; and History of intrauterine growth restriction in prior pregnancy, currently pregnant on their problem list.. Patient does intend to breast feed. Pregnancy history fully reviewed.  Patient reports no complaints.  HISTORY: OB History  Gravida Para Term Preterm AB Living  '4 2 2 ' 0 1 2  SAB TAB Ectopic Multiple Live Births  0 0 0 1 2    # Outcome Date GA Lbr Len/2nd Weight Sex Delivery Anes PTL Lv  4 Current           3A Term 08/28/17 366w2d5 lb 0.6 oz (2.285 kg) M CS-LTranv EPI  LIV     Name: NETTLES-BAIN,BOY Carrell     Apgar1: 8  Apgar5: 9  3B Gravida           2 Term 02/02/16    F Vag-Spont EPI N LIV  1 AB            Past Medical History:  Diagnosis Date  . Anxiety   . Depression   . Dyspnea    Past Surgical History:  Procedure Laterality Date  . CESAREAN SECTION N/A 08/28/2017   Procedure: CESAREAN SECTION;  Surgeon: StTruett MainlandDO;  [place] [facility] [facility] [place]: [place]s;  Laterality: N/A;  . NO PAST SURGERIES     Family History  Problem Relation Age of Onset  . Depression Mother    Social History   Tobacco Use  . Smoking status: Never Smoker  . Smokeless tobacco: Never Used  Vaping Use  . Vaping Use: Never used  Substance Use Topics  . Alcohol use: No  . Drug use: No   No Known Allergies Current Outpatient Medications on File Prior to Visit  Medication Sig Dispense Refill  . Blood Pressure Monitoring (BLOOD PRESSURE KIT) DEVI 1 kit by Does not apply route once a week. Check Blood Pressure regularly and record readings into the Babyscripts App.  Large Cuff.  DX O90.0 1 each 0  . Multiple Vitamins-Calcium (ONE-A-DAY WOMENS FORMULA PO) Take by mouth.      No current facility-administered medications on file prior to visit.     Indications for ASA therapy (per uptodate) One of the following: Previous pregnancy with preeclampsia, especially early onset and with an adverse outcome No Multifetal gestation No Chronic hypertension No Type 1 or 2 diabetes mellitus No Chronic kidney disease No Autoimmune disease (antiphospholipid syndrome, systemic lupus erythematosus) No   Two or more of the following: Nulliparity No Obesity (body mass index >30 kg/m2) No Family history of preeclampsia in mother or sister No Age ?35 years No Sociodemographic characteristics (African American race, low socioeconomic level) Yes Personal risk factors (eg, previous pregnancy with low birth weight or small for gestational age infant, previous adverse pregnancy outcome [eg, stillbirth], interval >10 years between pregnancies) Yes   Indications for early 1 hour GTT (per uptodate)  BMI >25 (>23 in Asian women) AND one of the following.   No, BMI 21  Exam   Vitals:   06/15/20 0951  BP: 96/67  Pulse: 70  Weight: 123 lb (55.8 kg)      VS reviewed, nursing note reviewed,  Constitutional: well developed, well nourished, no distress HEENT:  normocephalic CV: normal rate Pulm/chest wall: normal effort Breast Exam: Deferred with shared decision making Abdomen: soft Neuro: alert and oriented x 3 Skin: warm, dry Psych: affect normal Pelvic exam: Cervix pink, visually closed, without lesion, scant white creamy discharge, vaginal walls and external genitalia normal     Assessment:   Pregnancy: [id number] Patient Active Problem List   Diagnosis Date Noted  . History of intrauterine growth restriction in prior pregnancy, currently pregnant 06/15/2020  . Supervision of other normal pregnancy, antepartum 06/07/2020  . Post partum depression 04/18/2017     Plan:  1. History of intrauterine growth restriction in prior pregnancy, currently  pregnant --BASA - [place] MFM OB DETAIL +14 WK; Future  2. Supervision of high risk pregnancy, antepartum --Low risk pregnancy currently, hx FGR and C/S --Anticipatory guidance about next visits/weeks of pregnancy given. --Next visit in 4 weeks, virtual  - Pt had Pap 12/20/17 that was wnl - CBC/D/Plt+RPR+Rh+ABO+Rub Ab... - Genetic Screening - Cervicovaginal ancillary only( [place])  3. [redacted] weeks gestation of pregnancy  - [place] MFM OB DETAIL +14 WK; Future  4. History of cesarean delivery --Vaginal delivery x 1 then C/S for breech position. --Desires VBAC, needs consent  Initial labs drawn. Continue prenatal vitamins. Discussed and offered genetic screening options, including Quad screen/AFP, NIPS testing, and option to decline testing. Benefits/risks/alternatives reviewed. Pt aware that anatomy US is form of genetic screening with lower accuracy in detecting trisomies than blood work.  Pt chooses/declines genetic screening today. NIPS: declined. Ultrasound discussed; fetal anatomic survey: ordered. Problem list reviewed and updated. The nature of [facility] with multiple MDs and other Advanced Practice Providers was explained to patient; also emphasized that residents, students are part of our team. Routine obstetric precautions reviewed. Return in about 4 weeks (around 07/13/2020).   Fatima Blank, CNM 06/15/20 1:24 PM

## 2020-06-15 NOTE — Patient Instructions (Signed)

## 2020-06-16 ENCOUNTER — Other Ambulatory Visit: Payer: Self-pay

## 2020-06-16 DIAGNOSIS — O09299 Supervision of pregnancy with other poor reproductive or obstetric history, unspecified trimester: Secondary | ICD-10-CM

## 2020-06-16 LAB — CBC/D/PLT+RPR+RH+ABO+RUB AB...
Antibody Screen: NEGATIVE
Basophils Absolute: 0 10*3/uL (ref 0.0–0.2)
Basos: 0 %
EOS (ABSOLUTE): 0 10*3/uL (ref 0.0–0.4)
Eos: 1 %
HCV Ab: <0.1 s/co ratio (ref 0.0–0.9)
HIV Screen 4th Generation wRfx: NONREACTIVE
Hematocrit: 39.1 % (ref 34.0–46.6)
Hemoglobin: 12.5 g/dL (ref 11.1–15.9)
Hepatitis B Surface Ag: NEGATIVE
Immature Grans (Abs): 0 10*3/uL (ref 0.0–0.1)
Immature Granulocytes: 0 %
Lymphocytes Absolute: 2.5 10*3/uL (ref 0.7–3.1)
Lymphs: 38 %
MCH: 28.3 pg (ref 26.6–33.0)
MCHC: 32 g/dL (ref 31.5–35.7)
MCV: 89 fL (ref 79–97)
Monocytes Absolute: 0.5 10*3/uL (ref 0.1–0.9)
Monocytes: 8 %
Neutrophils Absolute: 3.5 10*3/uL (ref 1.4–7.0)
Neutrophils: 53 %
Platelets: 234 10*3/uL (ref 150–450)
RBC: 4.42 x10E6/uL (ref 3.77–5.28)
RDW: 14.3 % (ref 11.7–15.4)
RPR Ser Ql: NONREACTIVE
Rh Factor: POSITIVE
Rubella Antibodies, IGG: 1.29 index (ref >0.99)
WBC: 6.6 10*3/uL (ref 3.4–10.8)

## 2020-06-16 LAB — HCV INTERPRETATION

## 2020-06-16 MED ORDER — ASPIRIN EC 81 MG PO TBEC: 81.0000 mg | DELAYED_RELEASE_TABLET | Freq: Every day | ORAL | 11 refills | Status: DC

## 2020-06-16 NOTE — Telephone Encounter (Signed)
Pharmacy did not received her rx for Asprin 81 mg. I will go ahead and resend.

## 2020-06-17 LAB — CULTURE, OB URINE

## 2020-06-17 LAB — URINE CULTURE, OB REFLEX

## 2020-07-01 ENCOUNTER — Other Ambulatory Visit: Payer: Self-pay | Admitting: *Deleted

## 2020-07-01 ENCOUNTER — Ambulatory Visit: Payer: Medicaid Other | Attending: Advanced Practice Midwife

## 2020-07-01 ENCOUNTER — Ambulatory Visit: Payer: Medicaid Other | Admitting: *Deleted

## 2020-07-01 ENCOUNTER — Encounter: Payer: Self-pay | Admitting: *Deleted

## 2020-07-01 ENCOUNTER — Other Ambulatory Visit: Payer: Self-pay

## 2020-07-01 VITALS — BP 105/53 | HR 87

## 2020-07-01 DIAGNOSIS — Z362 Encounter for other antenatal screening follow-up: Secondary | ICD-10-CM

## 2020-07-01 DIAGNOSIS — O365990 Maternal care for other known or suspected poor fetal growth, unspecified trimester, not applicable or unspecified: Secondary | ICD-10-CM | POA: Insufficient documentation

## 2020-07-01 DIAGNOSIS — Z3480 Encounter for supervision of other normal pregnancy, unspecified trimester: Secondary | ICD-10-CM

## 2020-07-01 DIAGNOSIS — Z3A19 19 weeks gestation of pregnancy: Secondary | ICD-10-CM | POA: Diagnosis not present

## 2020-07-01 DIAGNOSIS — O09299 Supervision of pregnancy with other poor reproductive or obstetric history, unspecified trimester: Secondary | ICD-10-CM | POA: Diagnosis not present

## 2020-07-13 ENCOUNTER — Telehealth: Payer: Medicaid Other | Admitting: Advanced Practice Midwife

## 2020-07-13 DIAGNOSIS — O0990 Supervision of high risk pregnancy, unspecified, unspecified trimester: Secondary | ICD-10-CM

## 2020-07-13 NOTE — Progress Notes (Signed)
No show, pt called and was rescheduled with another provider.

## 2020-08-03 ENCOUNTER — Encounter: Payer: Self-pay | Admitting: Obstetrics

## 2020-08-03 ENCOUNTER — Telehealth (INDEPENDENT_AMBULATORY_CARE_PROVIDER_SITE_OTHER): Payer: Medicaid Other | Admitting: Obstetrics

## 2020-08-03 DIAGNOSIS — O26892 Other specified pregnancy related conditions, second trimester: Secondary | ICD-10-CM | POA: Diagnosis not present

## 2020-08-03 DIAGNOSIS — O34219 Maternal care for unspecified type scar from previous cesarean delivery: Secondary | ICD-10-CM | POA: Diagnosis not present

## 2020-08-03 DIAGNOSIS — O09292 Supervision of pregnancy with other poor reproductive or obstetric history, second trimester: Secondary | ICD-10-CM

## 2020-08-03 DIAGNOSIS — Z3A24 24 weeks gestation of pregnancy: Secondary | ICD-10-CM

## 2020-08-03 DIAGNOSIS — M549 Dorsalgia, unspecified: Secondary | ICD-10-CM | POA: Diagnosis not present

## 2020-08-03 DIAGNOSIS — O09299 Supervision of pregnancy with other poor reproductive or obstetric history, unspecified trimester: Secondary | ICD-10-CM

## 2020-08-03 DIAGNOSIS — Z98891 History of uterine scar from previous surgery: Secondary | ICD-10-CM

## 2020-08-03 DIAGNOSIS — O0990 Supervision of high risk pregnancy, unspecified, unspecified trimester: Secondary | ICD-10-CM

## 2020-08-03 MED ORDER — COMFORT FIT MATERNITY SUPP SM MISC: 0 refills | Status: DC

## 2020-08-03 NOTE — Progress Notes (Signed)
   TELEHEALTH OBSTETRICS VISIT ENCOUNTER NOTE  I connected with Gina Montoya on 08/03/20 at  3:15 PM EST by telephone at home and verified that I am speaking with the correct person using two identifiers.   I discussed the limitations, risks, security and privacy concerns of performing an evaluation and management service by telephone and the availability of in person appointments. I also discussed with the patient that there may be a patient responsible charge related to this service. The patient expressed understanding and agreed to proceed.  Subjective:  Gina Montoya is a 29 y.o. E3M6294 at [redacted]w[redacted]d being followed for ongoing prenatal care.  She is currently monitored for the following issues for this high-risk pregnancy and has Post partum depression; Supervision of other normal pregnancy, antepartum; and History of intrauterine growth restriction in prior pregnancy, currently pregnant on their problem list.  Patient reports backache and heartburn. Reports fetal movement. Denies any contractions, bleeding or leaking of fluid.   The following portions of the patient's history were reviewed and updated as appropriate: allergies, current medications, past family history, past medical history, past social history, past surgical history and problem list.   Objective:   General:  Alert, oriented and cooperative.   Mental Status: Normal mood and affect perceived. Normal judgment and thought content.  Rest of physical exam deferred due to type of encounter  Assessment and Plan:  Pregnancy: T6L4650 at [redacted]w[redacted]d 1. Supervision of high risk pregnancy, antepartum  2. History of intrauterine growth restriction in prior pregnancy, currently pregnant - followed by MFM  3. Backache symptom Rx: - Elastic Bandages & Supports (COMFORT FIT MATERNITY SUPP SM) MISC; Wear as directed.  Dispense: 1 each; Refill: 0  4. History of cesarean delivery - wants VBAC   Preterm labor symptoms and  general obstetric precautions including but not limited to vaginal bleeding, contractions, leaking of fluid and fetal movement were reviewed in detail with the patient.  I discussed the assessment and treatment plan with the patient. The patient was provided an opportunity to ask questions and all were answered. The patient agreed with the plan and demonstrated an understanding of the instructions. The patient was advised to call back or seek an in-person office evaluation/go to MAU at [facility] for any urgent or concerning symptoms. Please refer to After Visit Summary for other counseling recommendations.   I provided 15 minutes of non-face-to-face time during this encounter.  Return in about 4 weeks (around 08/31/2020) for [facility], 2 hour OGTT.  Future Appointments  Date Time Provider Department Center  08/31/2020  9:15 AM [facility] LAB [facility] None  08/31/2020  9:45 AM Johnny Bridge, MD [facility] None  09/23/2020 10:30 AM [facility] NURSE [facility]  09/23/2020 10:45 AM [facility] US4 [facility]    Coral Ceo, MD Center for [facility] 08/03/20

## 2020-08-03 NOTE — Progress Notes (Signed)
Pt will take BP later and let us know what it is

## 2020-08-31 ENCOUNTER — Other Ambulatory Visit: Payer: Medicaid Other

## 2020-08-31 ENCOUNTER — Other Ambulatory Visit: Payer: Self-pay

## 2020-08-31 ENCOUNTER — Ambulatory Visit (INDEPENDENT_AMBULATORY_CARE_PROVIDER_SITE_OTHER): Payer: Medicaid Other | Admitting: Obstetrics

## 2020-08-31 ENCOUNTER — Encounter: Payer: Self-pay | Admitting: Obstetrics

## 2020-08-31 VITALS — BP 92/58 | HR 92 | Wt 139.0 lb

## 2020-08-31 DIAGNOSIS — O0990 Supervision of high risk pregnancy, unspecified, unspecified trimester: Secondary | ICD-10-CM | POA: Diagnosis not present

## 2020-08-31 DIAGNOSIS — O09299 Supervision of pregnancy with other poor reproductive or obstetric history, unspecified trimester: Secondary | ICD-10-CM

## 2020-08-31 DIAGNOSIS — Z3480 Encounter for supervision of other normal pregnancy, unspecified trimester: Secondary | ICD-10-CM

## 2020-08-31 DIAGNOSIS — Z98891 History of uterine scar from previous surgery: Secondary | ICD-10-CM

## 2020-08-31 NOTE — Progress Notes (Signed)
OB/GTT.  Declined FLU and TDAP vaccines

## 2020-08-31 NOTE — Progress Notes (Signed)
Subjective:  Gina Montoya is a 29 y.o. [id number] at [redacted]w[redacted]d being seen today for ongoing prenatal care.  She is currently monitored for the following issues for this high-risk pregnancy and has Post partum depression; Supervision of other normal pregnancy, antepartum; and History of intrauterine growth restriction in prior pregnancy, currently pregnant on their problem list.  Patient reports pelvic pressure.   .  .   . Denies leaking of fluid.   The following portions of the patient's history were reviewed and updated as appropriate: allergies, current medications, past family history, past medical history, past social history, past surgical history and problem list. Problem list updated.  Objective:  There were no vitals filed for this visit.  Fetal Status:           General:  Alert, oriented and cooperative. Patient is in no acute distress.  Skin: Skin is warm and dry. No rash noted.   Cardiovascular: Normal heart rate noted  Respiratory: Normal respiratory effort, no problems with respiration noted  Abdomen: Soft, gravid, appropriate for gestational age.       Pelvic:  Cervical exam deferred        Extremities: Normal range of motion.     Mental Status: Normal mood and affect. Normal behavior. Normal judgment and thought content.   Urinalysis:      Assessment and Plan:  Pregnancy: [id number] at [redacted]w[redacted]d  1. Supervision of high risk pregnancy, antepartum Rx: - Glucose Tolerance, 2 Hours w/1 Hour - CBC - HIV Antibody (routine testing w rflx) - RPR  2. History of intrauterine growth restriction in prior pregnancy, currently pregnant - interval growth ultrasound scheduled  3. History of cesarean delivery - wants VBAC  4. Backache symptom - Maternity Belt Rx   Preterm labor symptoms and general obstetric precautions including but not limited to vaginal bleeding, contractions, leaking of fluid and fetal movement were reviewed in detail with the patient. Please refer to After  Visit Summary for other counseling recommendations.   Return in about 2 weeks (around 09/14/2020) for [facility].   Brock Bad, MD  08/31/20

## 2020-09-01 ENCOUNTER — Other Ambulatory Visit: Payer: Self-pay | Admitting: Obstetrics

## 2020-09-01 DIAGNOSIS — O99019 Anemia complicating pregnancy, unspecified trimester: Secondary | ICD-10-CM

## 2020-09-01 LAB — RPR: RPR Ser Ql: NONREACTIVE

## 2020-09-01 LAB — CBC
Hematocrit: 31.9 % — ABNORMAL LOW (ref 34.0–46.6)
Hemoglobin: 10.8 g/dL — ABNORMAL LOW (ref 11.1–15.9)
MCH: 29.7 pg (ref 26.6–33.0)
MCHC: 33.9 g/dL (ref 31.5–35.7)
MCV: 88 fL (ref 79–97)
Platelets: 194 10*3/uL (ref 150–450)
RBC: 3.64 x10E6/uL — ABNORMAL LOW (ref 3.77–5.28)
RDW: 13.1 % (ref 11.7–15.4)
WBC: 7.1 10*3/uL (ref 3.4–10.8)

## 2020-09-01 LAB — GLUCOSE TOLERANCE, 2 HOURS W/ 1HR
Glucose, 1 hour: 119 mg/dL (ref 65–179)
Glucose, 2 hour: 87 mg/dL (ref 65–152)
Glucose, Fasting: 73 mg/dL (ref 65–91)

## 2020-09-01 LAB — HIV ANTIBODY (ROUTINE TESTING W REFLEX): HIV Screen 4th Generation wRfx: NONREACTIVE

## 2020-09-01 MED ORDER — FERROUS SULFATE 325 (65 FE) MG PO TABS: 325.0000 mg | ORAL_TABLET | ORAL | 5 refills | Status: DC

## 2020-09-14 ENCOUNTER — Encounter: Payer: Self-pay | Admitting: Obstetrics & Gynecology

## 2020-09-14 ENCOUNTER — Other Ambulatory Visit: Payer: Self-pay

## 2020-09-14 ENCOUNTER — Ambulatory Visit (HOSPITAL_COMMUNITY)
Admission: EM | Admit: 2020-09-14 | Discharge: 2020-09-14 | Disposition: A | Discharge status: Home / Self Care | Payer: Medicaid Other | Attending: Family Medicine | Admitting: Family Medicine

## 2020-09-14 ENCOUNTER — Telehealth (INDEPENDENT_AMBULATORY_CARE_PROVIDER_SITE_OTHER): Payer: Medicaid Other | Admitting: Obstetrics & Gynecology

## 2020-09-14 ENCOUNTER — Encounter (HOSPITAL_COMMUNITY): Payer: Self-pay | Admitting: Emergency Medicine

## 2020-09-14 VITALS — BP 114/68 | HR 104

## 2020-09-14 DIAGNOSIS — O99019 Anemia complicating pregnancy, unspecified trimester: Secondary | ICD-10-CM

## 2020-09-14 DIAGNOSIS — U071 COVID-19: Secondary | ICD-10-CM | POA: Insufficient documentation

## 2020-09-14 DIAGNOSIS — Z3A30 30 weeks gestation of pregnancy: Secondary | ICD-10-CM | POA: Insufficient documentation

## 2020-09-14 DIAGNOSIS — Z3480 Encounter for supervision of other normal pregnancy, unspecified trimester: Secondary | ICD-10-CM

## 2020-09-14 DIAGNOSIS — O98513 Other viral diseases complicating pregnancy, third trimester: Secondary | ICD-10-CM | POA: Insufficient documentation

## 2020-09-14 DIAGNOSIS — R059 Cough, unspecified: Secondary | ICD-10-CM

## 2020-09-14 DIAGNOSIS — Z20822 Contact with and (suspected) exposure to covid-19: Secondary | ICD-10-CM

## 2020-09-14 DIAGNOSIS — O99891 Other specified diseases and conditions complicating pregnancy: Secondary | ICD-10-CM

## 2020-09-14 DIAGNOSIS — O09293 Supervision of pregnancy with other poor reproductive or obstetric history, third trimester: Secondary | ICD-10-CM

## 2020-09-14 DIAGNOSIS — J069 Acute upper respiratory infection, unspecified: Secondary | ICD-10-CM | POA: Diagnosis not present

## 2020-09-14 DIAGNOSIS — O34219 Maternal care for unspecified type scar from previous cesarean delivery: Secondary | ICD-10-CM

## 2020-09-14 DIAGNOSIS — R102 Pelvic and perineal pain: Secondary | ICD-10-CM

## 2020-09-14 DIAGNOSIS — N898 Other specified noninflammatory disorders of vagina: Secondary | ICD-10-CM

## 2020-09-14 DIAGNOSIS — O99013 Anemia complicating pregnancy, third trimester: Secondary | ICD-10-CM

## 2020-09-14 DIAGNOSIS — D649 Anemia, unspecified: Secondary | ICD-10-CM

## 2020-09-14 LAB — POCT URINALYSIS DIPSTICK, ED / UC
Bilirubin Urine: NEGATIVE
Glucose, UA: NEGATIVE mg/dL
Hgb urine dipstick: NEGATIVE
Ketones, ur: NEGATIVE mg/dL
Nitrite: NEGATIVE
Protein, ur: NEGATIVE mg/dL
Specific Gravity, Urine: 1.015 (ref 1.005–1.030)
Urobilinogen, UA: 1 mg/dL (ref 0.0–1.0)
pH: 6 (ref 5.0–8.0)

## 2020-09-14 NOTE — Progress Notes (Signed)
I connected with  Lilia Pro on 09/14/20 by a video enabled telemedicine application and verified that I am speaking with the correct person using two identifiers.   I discussed the limitations of evaluation and management by telemedicine. The patient expressed understanding and agreed to proceed.  Provider: [facility]  Patient:  HOME   Mych[facility] OB c/o constant abdominal pain, hip pain 10/10 x 4 days. Maternity belt does not work, cold sweats, yellow/greenish vaginal discharge, itching, odor and cough.  Denies dysuria. Her Partner is going to get checked for Covid-19.

## 2020-09-14 NOTE — Discharge Instructions (Signed)
Go home to rest Drink plenty of fluids Take Tylenol for pain or fever You may take over-the-counter cough and cold medicines as needed You must quarantine at home until your test result is available You can check for your test result in MyChart The urine culture will be on my Chart, you will be sent antibiotics if needed

## 2020-09-14 NOTE — ED Triage Notes (Signed)
Pt c/o cold sx onset 3 days associated w/cough, abd pain, chills, headaches  Taking Acetaminophen w/temp relief  Pt is 30 weeks preg  A&O x4... NAD.... ambulatory

## 2020-09-14 NOTE — Progress Notes (Signed)
I connected with Gina Montoya 09/14/20 at  2:45 PM EST by: telephone and verified that I am speaking with the correct person using two identifiers.  We attempted multiple times to connect via MyChart for virtual visit multiple times before I called patient.  Patient is located at Urgent Care being tested for Covid and provider is located at [place] office.     The purpose of this virtual visit is to provide medical care while limiting exposure to the novel coronavirus. I discussed the limitations, risks, security and privacy concerns of performing an evaluation and management service by MyChart video and the availability of in person appointments. I also discussed with the patient that there may be a patient responsible charge related to this service. By engaging in this virtual visit, you consent to the provision of healthcare.  Additionally, you authorize for your insurance to be billed for the services provided during this visit.  The patient expressed understanding and agreed to proceed.  The following staff members participated in the virtual visit:  Georgana Curio, RMA    PRENATAL VISIT NOTE  Subjective:  Gina Montoya is a 30 y.o. [id number] at [redacted]w[redacted]d  for phone visit for ongoing prenatal care.  She is currently monitored for the following issues for this high-risk pregnancy and has Post partum depression; Supervision of other normal pregnancy, antepartum; and History of intrauterine growth restriction in prior pregnancy, currently pregnant on their problem list.  Patient reports hip pain.  She is at Urgent Care right now being tested for Covid.   Significant other has upper respiratory symptoms and decided they should be tested.  Pt has a cough as well.  Denies SOB or fevers.    She does report some vaginal discharge and would like to be tested for vaginitis disorders.    Contractions: Irritability. Vag. Bleeding: None.  Movement: Present. Denies leaking of fluid.   The following  portions of the patient's history were reviewed and updated as appropriate: allergies, current medications, past family history, past medical history, past social history, past surgical history and problem list.   Objective:   Vitals:   09/14/20 1311  BP: 114/68  Pulse: (!) 104   Self-Obtained  Fetal Status:     Movement: Present     Assessment and Plan:  Pregnancy: G4P2012 at [redacted]w[redacted]d  1.  30 weeks pregnancy -on PNV - 28 week lab work discussed -Return for routine visit 2 weeks  2.  H/o IUGR with prior pregnancy - pt not taking baby ASA.  States "I finished it".  Guidelines for h/o IUGR reviewed.  Pt states she will get this and start again. - Growth scan scheduled for 09/24/2020 with MFM.  Pt aware.  3.  Anemia affecting pregnancy - pt has not started iron supplements.  Was unaware this was at pharmacy.  States will start it.  4.  Cough - being tested for Covid today.  Advised to call if has positive test so we are away and can monitor for possible outpatient therapy  5.  Vaginal discharge - pt will call for appt for testing once knows about Covid results  6.  H/o cesarean section - desires VBAC.  Risks and benefits reviewed today.  Recommended pt sign consent at next OV.  Preterm labor symptoms and general obstetric precautions including but not limited to vaginal bleeding, contractions, leaking of fluid and fetal movement were reviewed in detail with the patient.  Return in about 2 weeks (around 09/28/2020) for Virtual follow-up, Office ob  visit (MD or APP).  Future Appointments  Date Time Provider Department Center  09/23/2020 10:30 AM [facility] NURSE [facility]  09/23/2020 10:45 AM [facility] US4 [facility]    Time spent on virtual visit: 12 minutes  Jerene Bears, MD

## 2020-09-14 NOTE — ED Provider Notes (Signed)
[facility]    CSN: [id number] Arrival date & time: 09/14/20  1428      History   Chief Complaint Chief Complaint  Patient presents with  . URI    HPI Gina Montoya is a 30 y.o. female.   HPI   Patient is [redacted] weeks pregnant.  She is having some lower abdominal pain. She is also having cold symptoms with cough chills headache fever.  Nausea but no vomiting.  No diarrhea.  No known exposure to Covid.  Is not Covid vaccinated She is here with her significant other and 2 children, all of them have cold symptoms She spoke with her OB provider today about her abdominal pain.  They said that once she gets her Covid test, they would like to hear from her regarding how her abdomen pain is responding, and will see her if needed  I advised her that if her abdominal pain gets worse instead of better especially if she has bleeding, she needs to go to the women's emergency room regardless of Covid status  Past Medical History:  Diagnosis Date  . Anxiety   . Depression   . Dyspnea     Patient Active Problem List   Diagnosis Date Noted  . History of intrauterine growth restriction in prior pregnancy, currently pregnant 06/15/2020  . Supervision of other normal pregnancy, antepartum 06/07/2020  . Post partum depression 04/18/2017    Past Surgical History:  Procedure Laterality Date  . CESAREAN SECTION N/A 08/28/2017   Procedure: CESAREAN SECTION;  Surgeon: Levie Heritage, DO;  Location: [facility]IRTH[facility];  Service: Obstetrics;  Laterality: N/A;    OB History    Gravida  4   Para  2   Term  2   Preterm      AB  1   Living  2     SAB      IAB      Ectopic      Multiple  1   Live Births  2            Home Medications    Prior to Admission medications   Medication Sig Start Date End Date Taking? Authorizing Provider  Elastic Bandages & Supports (COMFORT FIT MATERNITY SUPP SM) MISC Wear as directed. 08/03/20   Brock Bad, MD   ferrous sulfate 325 (65 FE) MG tablet Take 1 tablet (325 mg total) by mouth every other day. 09/01/20   Brock Bad, MD    Family History Family History  Problem Relation Age of Onset  . Depression Mother     Social History Social History   Tobacco Use  . Smoking status: Never Smoker  . Smokeless tobacco: Never Used  Vaping Use  . Vaping Use: Never used  Substance Use Topics  . Alcohol use: No  . Drug use: No     Allergies   Patient has no known allergies.   Review of Systems Review of Systems See HPI  Physical Exam Triage Vital Signs ED Triage Vitals  Enc Vitals Group     BP 09/14/20 1651 90/65     Pulse Rate 09/14/20 1651 (!) 115     Resp 09/14/20 1651 (!) 22     Temp 09/14/20 1651 98.6 F (37 C)     Temp Source 09/14/20 1651 Oral     SpO2 09/14/20 1651 99 %     Weight --      Height --      Head  Circumference --      Peak Flow --      Pain Score 09/14/20 1650 6     Pain Loc --      Pain Edu? --      Excl. in GC? --    No data found.  Updated Vital Signs BP 90/65 (BP Location: Left Arm)   Pulse (!) 115   Temp 98.6 F (37 C) (Oral)   Resp (!) 22   LMP 02/14/2020 (Exact Date)   SpO2 99%   Physical Exam Constitutional:      General: She is not in acute distress.    Appearance: She is well-developed and well-nourished. She is ill-appearing.     Comments: Appears tired  HENT:     Head: Normocephalic and atraumatic.     Mouth/Throat:     Mouth: Oropharynx is clear and moist.  Eyes:     Conjunctiva/sclera: Conjunctivae normal.     Pupils: Pupils are equal, round, and reactive to light.  Cardiovascular:     Rate and Rhythm: Normal rate and regular rhythm.     Heart sounds: Normal heart sounds.  Pulmonary:     Effort: Pulmonary effort is normal. No respiratory distress.     Breath sounds: Normal breath sounds.     Comments: Heart and lung exam normal Abdominal:     Palpations: Abdomen is soft.     Comments: gravid  Musculoskeletal:         General: No edema. Normal range of motion.     Cervical back: Normal range of motion.  Skin:    General: Skin is warm and dry.  Neurological:     General: No focal deficit present.     Mental Status: She is alert.  Psychiatric:        Behavior: Behavior normal.      UC Treatments / Results  Labs (all labs ordered are listed, but only abnormal results are displayed) Labs Reviewed  POCT URINALYSIS DIPSTICK, ED / UC - Abnormal; Notable for the following components:      Result Value   Leukocytes,Ua LARGE (*)    All other components within normal limits  SARS CORONAVIRUS 2 (TAT 6-24 HRS)  URINE CULTURE    EKG   Radiology No results found.  Procedures Procedures (including critical care time)  Medications Ordered in UC Medications - No data to display  Initial Impression / Assessment and Plan / UC Course  I have reviewed the triage vital signs and the nursing notes.  Pertinent labs & imaging results that were available during my care of the patient were reviewed by me and considered in my medical decision making (see chart for details).     Testing pending Importance of quarantine expressed Final Clinical Impressions(s) / UC Diagnoses   Final diagnoses:  Viral upper respiratory tract infection  Suprapubic abdominal pain  Pregnancy with 30 completed weeks gestation  Encounter for laboratory testing for COVID-19 virus     Discharge Instructions     Go home to rest Drink plenty of fluids Take Tylenol for pain or fever You may take over-the-counter cough and cold medicines as needed You must quarantine at home until your test result is available You can check for your test result in MyChart The urine culture will be on my Chart, you will be sent antibiotics if needed     ED Prescriptions    None     PDMP not reviewed this encounter.   Eustace Moore, MD  09/14/20 1753  

## 2020-09-15 LAB — SARS CORONAVIRUS 2 (TAT 6-24 HRS): SARS Coronavirus 2: POSITIVE — AB

## 2020-09-16 LAB — URINE CULTURE: Culture: 80000 — AB

## 2020-09-23 ENCOUNTER — Encounter: Payer: Self-pay | Admitting: *Deleted

## 2020-09-23 ENCOUNTER — Other Ambulatory Visit: Payer: Self-pay

## 2020-09-23 ENCOUNTER — Ambulatory Visit: Payer: Medicaid Other | Admitting: *Deleted

## 2020-09-23 ENCOUNTER — Ambulatory Visit: Payer: Medicaid Other | Attending: Obstetrics and Gynecology

## 2020-09-23 ENCOUNTER — Other Ambulatory Visit: Payer: Self-pay | Admitting: *Deleted

## 2020-09-23 VITALS — BP 104/57 | HR 87

## 2020-09-23 DIAGNOSIS — O34219 Maternal care for unspecified type scar from previous cesarean delivery: Secondary | ICD-10-CM | POA: Diagnosis not present

## 2020-09-23 DIAGNOSIS — Z362 Encounter for other antenatal screening follow-up: Secondary | ICD-10-CM | POA: Diagnosis not present

## 2020-09-23 DIAGNOSIS — O283 Abnormal ultrasonic finding on antenatal screening of mother: Secondary | ICD-10-CM | POA: Diagnosis not present

## 2020-09-23 DIAGNOSIS — Z3A30 30 weeks gestation of pregnancy: Secondary | ICD-10-CM | POA: Diagnosis not present

## 2020-09-23 DIAGNOSIS — O09293 Supervision of pregnancy with other poor reproductive or obstetric history, third trimester: Secondary | ICD-10-CM

## 2020-09-23 DIAGNOSIS — Z8759 Personal history of other complications of pregnancy, childbirth and the puerperium: Secondary | ICD-10-CM

## 2020-10-12 ENCOUNTER — Other Ambulatory Visit (HOSPITAL_COMMUNITY)
Admission: RE | Admit: 2020-10-12 | Discharge: 2020-10-12 | Disposition: A | Discharge status: Home / Self Care | Payer: Medicaid Other | Source: Ambulatory Visit | Attending: Obstetrics and Gynecology | Admitting: Obstetrics and Gynecology

## 2020-10-12 ENCOUNTER — Other Ambulatory Visit: Payer: Self-pay

## 2020-10-12 ENCOUNTER — Encounter: Payer: Self-pay | Admitting: Obstetrics and Gynecology

## 2020-10-12 ENCOUNTER — Ambulatory Visit (INDEPENDENT_AMBULATORY_CARE_PROVIDER_SITE_OTHER): Payer: Medicaid Other | Admitting: Obstetrics and Gynecology

## 2020-10-12 VITALS — BP 101/64 | HR 99 | Temp 97.3°F | Wt 144.0 lb

## 2020-10-12 DIAGNOSIS — U071 COVID-19: Secondary | ICD-10-CM

## 2020-10-12 DIAGNOSIS — Z98891 History of uterine scar from previous surgery: Secondary | ICD-10-CM

## 2020-10-12 DIAGNOSIS — Z3480 Encounter for supervision of other normal pregnancy, unspecified trimester: Secondary | ICD-10-CM | POA: Insufficient documentation

## 2020-10-12 DIAGNOSIS — O09299 Supervision of pregnancy with other poor reproductive or obstetric history, unspecified trimester: Secondary | ICD-10-CM

## 2020-10-12 DIAGNOSIS — O98519 Other viral diseases complicating pregnancy, unspecified trimester: Secondary | ICD-10-CM

## 2020-10-12 NOTE — Progress Notes (Signed)
Pt states she may have yeast infection, will do self swab today.

## 2020-10-12 NOTE — Progress Notes (Signed)
Subjective:  Gina Montoya is a 30 y.o. O2V0350 at [redacted]w[redacted]d being seen today for ongoing prenatal care.  She is currently monitored for the following issues for this high-risk pregnancy and has Post partum depression; Supervision of other normal pregnancy, antepartum; History of intrauterine growth restriction in prior pregnancy, currently pregnant; History of cesarean section; and COVID-19 affecting pregnancy, antepartum on their problem list.  Patient reports no complaints.  Contractions: Not present. Vag. Bleeding: None.  Movement: Present. Denies leaking of fluid.   The following portions of the patient's history were reviewed and updated as appropriate: allergies, current medications, past family history, past medical history, past social history, past surgical history and problem list. Problem list updated.  Objective:   Vitals:   10/12/20 1607  BP: 101/64  Pulse: 99  Temp: (!) 97.3 F (36.3 C)  Weight: 144 lb (65.3 kg)    Fetal Status: Fetal Heart Rate (bpm): 155   Movement: Present     General:  Alert, oriented and cooperative. Patient is in no acute distress.  Skin: Skin is warm and dry. No rash noted.   Cardiovascular: Normal heart rate noted  Respiratory: Normal respiratory effort, no problems with respiration noted  Abdomen: Soft, gravid, appropriate for gestational age. Pain/Pressure: Absent     Pelvic:  Cervical exam deferred        Extremities: Normal range of motion.     Mental Status: Normal mood and affect. Normal behavior. Normal judgment and thought content.   Urinalysis:      Assessment and Plan:  Pregnancy: [id number] at [redacted]w[redacted]d  1. Supervision of other normal pregnancy, antepartum Stable - Cervicovaginal ancillary only( [place])  2. History of intrauterine growth restriction in prior pregnancy, currently pregnant Serial growth scans as per MFM  3. History of cesarean section TOLAC papers signed today  4. COVID-19 affecting pregnancy,  antepartum Antenatal testing starting at 36 weeks per MFM  Preterm labor symptoms and general obstetric precautions including but not limited to vaginal bleeding, contractions, leaking of fluid and fetal movement were reviewed in detail with the patient. Please refer to After Visit Summary for other counseling recommendations.  Return in about 2 weeks (around 10/26/2020) for OB visit, face to face, any provider, GBS.   Hermina Staggers, MD

## 2020-10-12 NOTE — Patient Instructions (Signed)

## 2020-10-13 LAB — CERVICOVAGINAL ANCILLARY ONLY
Bacterial Vaginitis (gardnerella): POSITIVE — AB
Candida Glabrata: NEGATIVE
Candida Vaginitis: POSITIVE — AB
Comment: NEGATIVE
Comment: NEGATIVE
Comment: NEGATIVE

## 2020-10-14 ENCOUNTER — Other Ambulatory Visit: Payer: Self-pay

## 2020-10-14 DIAGNOSIS — B3731 Acute candidiasis of vulva and vagina: Secondary | ICD-10-CM

## 2020-10-14 DIAGNOSIS — B373 Candidiasis of vulva and vagina: Secondary | ICD-10-CM

## 2020-10-14 DIAGNOSIS — B9689 Other specified bacterial agents as the cause of diseases classified elsewhere: Secondary | ICD-10-CM

## 2020-10-14 MED ORDER — METRONIDAZOLE 500 MG PO TABS: 500.0000 mg | ORAL_TABLET | Freq: Two times a day (BID) | ORAL | 0 refills | Status: DC

## 2020-10-14 MED ORDER — TERCONAZOLE 0.4 % VA CREA
1.0000 | TOPICAL_CREAM | Freq: Every day | VAGINAL | 0 refills | Status: DC
Start: 2020-10-14 — End: 2020-11-16

## 2020-10-14 NOTE — Progress Notes (Signed)
Tx Rx sent as advised by Dr.Ervin Mychart message was sent to pt by provider.

## 2020-10-21 ENCOUNTER — Ambulatory Visit: Payer: Medicaid Other

## 2020-10-21 ENCOUNTER — Ambulatory Visit: Payer: Medicaid Other | Attending: Obstetrics and Gynecology

## 2020-10-26 ENCOUNTER — Other Ambulatory Visit: Payer: Self-pay

## 2020-10-26 ENCOUNTER — Ambulatory Visit (INDEPENDENT_AMBULATORY_CARE_PROVIDER_SITE_OTHER): Payer: Medicaid Other | Admitting: Obstetrics and Gynecology

## 2020-10-26 ENCOUNTER — Encounter: Payer: Self-pay | Admitting: Obstetrics and Gynecology

## 2020-10-26 VITALS — BP 109/74 | HR 108 | Wt 147.1 lb

## 2020-10-26 DIAGNOSIS — O98519 Other viral diseases complicating pregnancy, unspecified trimester: Secondary | ICD-10-CM

## 2020-10-26 DIAGNOSIS — Z3480 Encounter for supervision of other normal pregnancy, unspecified trimester: Secondary | ICD-10-CM

## 2020-10-26 DIAGNOSIS — Z98891 History of uterine scar from previous surgery: Secondary | ICD-10-CM

## 2020-10-26 DIAGNOSIS — O09299 Supervision of pregnancy with other poor reproductive or obstetric history, unspecified trimester: Secondary | ICD-10-CM

## 2020-10-26 DIAGNOSIS — Z3A34 34 weeks gestation of pregnancy: Secondary | ICD-10-CM

## 2020-10-26 DIAGNOSIS — U071 COVID-19: Secondary | ICD-10-CM

## 2020-10-26 NOTE — Progress Notes (Signed)
   PRENATAL VISIT NOTE  Subjective:  Gina Montoya is a 30 y.o. Z3A0762 at [redacted]w[redacted]d being seen today for ongoing prenatal care.  She is currently monitored for the following issues for this high-risk pregnancy and has Post partum depression; Supervision of other normal pregnancy, antepartum; History of intrauterine growth restriction in prior pregnancy, currently pregnant; History of cesarean section; and COVID-19 affecting pregnancy, antepartum on their problem list.  Patient reports no complaints, occasional Braxton-Hicks.  Contractions: Not present. Vag. Bleeding: None.  Movement: Present. Denies leaking of fluid.   The following portions of the patient's history were reviewed and updated as appropriate: allergies, current medications, past family history, past medical history, past social history, past surgical history and problem list.   Objective:   Vitals:   10/26/20 1559  BP: 109/74  Pulse: (!) 108  Weight: 147 lb 1.6 oz (66.7 kg)    Fetal Status: Fetal Heart Rate (bpm): 147   Movement: Present     General:  Alert, oriented and cooperative. Patient is in no acute distress.  Skin: Skin is warm and dry. No rash noted.   Cardiovascular: Normal heart rate noted  Respiratory: Normal respiratory effort, no problems with respiration noted  Abdomen: Soft, gravid, appropriate for gestational age.  Pain/Pressure: Absent     Pelvic: Cervical exam deferred        Extremities: Normal range of motion.  Edema: None  Mental Status: Normal mood and affect. Normal behavior. Normal judgment and thought content.   Assessment and Plan:  Pregnancy: [id number] at [redacted]w[redacted]d  1. COVID-19 affecting pregnancy, antepartum Testing to start 36 weeks  2. History of cesarean section Plans for TOLAC  3. History of intrauterine growth restriction in prior pregnancy, currently pregnant Last growth 24%tile Next growth 11/04/20  4. Supervision of other normal pregnancy, antepartum  5. [redacted] weeks gestation  of pregnancy   Preterm labor symptoms and general obstetric precautions including but not limited to vaginal bleeding, contractions, leaking of fluid and fetal movement were reviewed in detail with the patient. Please refer to After Visit Summary for other counseling recommendations.   Return in about 2 weeks (around 11/09/2020) for high OB, in person, 36 week swabs.  Future Appointments  Date Time Provider Department Center  11/04/2020  7:45 AM [facility] NURSE [facility]  11/04/2020  8:00 AM [facility] US1 [facility]  11/09/2020  3:45 PM Adam Phenix, MD [facility] None  11/11/2020  7:45 AM [facility] NURSE [facility]  11/11/2020  8:00 AM [facility] US1 [facility]    Conan Bowens, MD

## 2020-11-04 ENCOUNTER — Encounter: Payer: Self-pay | Admitting: *Deleted

## 2020-11-04 ENCOUNTER — Ambulatory Visit: Payer: Medicaid Other | Attending: Obstetrics and Gynecology

## 2020-11-04 ENCOUNTER — Ambulatory Visit: Payer: Medicaid Other | Admitting: *Deleted

## 2020-11-04 ENCOUNTER — Other Ambulatory Visit: Payer: Self-pay

## 2020-11-04 VITALS — BP 110/59 | HR 88

## 2020-11-04 DIAGNOSIS — Z3A36 36 weeks gestation of pregnancy: Secondary | ICD-10-CM | POA: Diagnosis not present

## 2020-11-04 DIAGNOSIS — O34219 Maternal care for unspecified type scar from previous cesarean delivery: Secondary | ICD-10-CM

## 2020-11-04 DIAGNOSIS — O09293 Supervision of pregnancy with other poor reproductive or obstetric history, third trimester: Secondary | ICD-10-CM

## 2020-11-04 DIAGNOSIS — Z8616 Personal history of COVID-19: Secondary | ICD-10-CM | POA: Diagnosis not present

## 2020-11-04 DIAGNOSIS — U071 COVID-19: Secondary | ICD-10-CM | POA: Insufficient documentation

## 2020-11-04 DIAGNOSIS — Z8759 Personal history of other complications of pregnancy, childbirth and the puerperium: Secondary | ICD-10-CM | POA: Diagnosis not present

## 2020-11-09 ENCOUNTER — Encounter: Payer: Self-pay | Admitting: Obstetrics & Gynecology

## 2020-11-09 ENCOUNTER — Ambulatory Visit (INDEPENDENT_AMBULATORY_CARE_PROVIDER_SITE_OTHER): Payer: Medicaid Other | Admitting: Obstetrics & Gynecology

## 2020-11-09 ENCOUNTER — Other Ambulatory Visit (HOSPITAL_COMMUNITY)
Admission: RE | Admit: 2020-11-09 | Discharge: 2020-11-09 | Disposition: A | Discharge status: Home / Self Care | Payer: Medicaid Other | Source: Ambulatory Visit | Attending: Obstetrics & Gynecology | Admitting: Obstetrics & Gynecology

## 2020-11-09 ENCOUNTER — Other Ambulatory Visit: Payer: Self-pay

## 2020-11-09 VITALS — BP 109/70 | HR 109 | Wt 153.0 lb

## 2020-11-09 DIAGNOSIS — Z3480 Encounter for supervision of other normal pregnancy, unspecified trimester: Secondary | ICD-10-CM

## 2020-11-09 DIAGNOSIS — O09299 Supervision of pregnancy with other poor reproductive or obstetric history, unspecified trimester: Secondary | ICD-10-CM

## 2020-11-09 DIAGNOSIS — Z98891 History of uterine scar from previous surgery: Secondary | ICD-10-CM

## 2020-11-09 NOTE — Progress Notes (Signed)
   PRENATAL VISIT NOTE  Subjective:  Gina Montoya is a 30 y.o. O2V0350 at [redacted]w[redacted]d being seen today for ongoing prenatal care.  She is currently monitored for the following issues for this low-risk pregnancy and has Post partum depression; Supervision of other normal pregnancy, antepartum; History of intrauterine growth restriction in prior pregnancy, currently pregnant; History of cesarean section; and COVID-19 affecting pregnancy, antepartum on their problem list.  Patient reports no bleeding, occasional contractions and vaginal irritation.  Contractions: Irritability. Vag. Bleeding: None.  Movement: Present. Denies leaking of fluid.   The following portions of the patient's history were reviewed and updated as appropriate: allergies, current medications, past family history, past medical history, past social history, past surgical history and problem list.   Objective:   Vitals:   11/09/20 1608  BP: 109/70  Pulse: (!) 109  Weight: 153 lb (69.4 kg)    Fetal Status: Fetal Heart Rate (bpm): 158 Fundal Height: 35 cm Movement: Present  Presentation: Vertex  General:  Alert, oriented and cooperative. Patient is in no acute distress.  Skin: Skin is warm and dry. No rash noted.   Cardiovascular: Normal heart rate noted  Respiratory: Normal respiratory effort, no problems with respiration noted  Abdomen: Soft, gravid, appropriate for gestational age.  Pain/Pressure: Present     Pelvic: Cervical exam performed in the presence of a chaperone Dilation: Fingertip Effacement (%): 30 Station: Ballotable  Extremities: Normal range of motion.  Edema: None  Mental Status: Normal mood and affect. Normal behavior. Normal judgment and thought content.   Assessment and Plan:  Pregnancy: [id number] at [redacted]w[redacted]d 1. History of cesarean section Plans TOLAC  2. History of intrauterine growth restriction in prior pregnancy, currently pregnant Normal growth 30%ile last week  3. Supervision of other normal  pregnancy, antepartum Doing well  Preterm labor symptoms and general obstetric precautions including but not limited to vaginal bleeding, contractions, leaking of fluid and fetal movement were reviewed in detail with the patient. Please refer to After Visit Summary for other counseling recommendations.   Return in about 1 week (around 11/16/2020).  No future appointments.  Scheryl Darter, MD

## 2020-11-09 NOTE — Progress Notes (Signed)
ROB/GBS, reports no problems today. 

## 2020-11-09 NOTE — Patient Instructions (Signed)
Vaginal Birth After Cesarean Delivery  Vaginal birth after cesarean delivery (VBAC) is giving birth vaginally after previously delivering a baby through a cesarean section (C-section). A VBAC may be a safe option for you, depending on your health and other factors. It is important to discuss VBAC with your health care provider early in your pregnancy so you can understand the risks, benefits, and options. Having these discussions early will give you time to make your birth plan. Who are the best candidates for VBAC? The best candidates for VBAC are women who:  Have had one or two prior cesarean deliveries, and the incision made during the delivery was horizontal (low transverse).  Do not have a vertical (classical) scar on their uterus.  Have not had a tear in the wall of their uterus (uterine rupture).  Plan to have more pregnancies. A VBAC is also more likely to be successful:  In women who have previously given birth vaginally.  When labor starts by itself (spontaneously) before the due date. What are the benefits of VBAC? The benefits of delivering your baby vaginally instead of by a cesarean delivery include:  A shorter hospital stay.  A faster recovery time.  Less pain.  Avoiding risks associated with major surgery, such as infection and blood clots.  Less blood loss and less need for donated blood (transfusions). What are the risks of VBAC? The main risk of attempting a VBAC is that it may fail, forcing your health care provider to deliver your baby by a C-section. Other risks are rare and include:  Tearing (rupture) of the scar from a past cesarean delivery.  Other risks associated with vaginal deliveries. If a repeat cesarean delivery is needed, the risks include:  Blood loss.  Infection.  Blood clot.  Damage to surrounding organs.  Removal of the uterus (hysterectomy), if it is damaged.  Placenta problems in future pregnancies. What else should I know  about my options? Delivering a baby through a VBAC is similar to having a normal spontaneous vaginal delivery. Therefore, it is safe:  To try with twins.  For your health care provider to try to turn the baby from a breech position (external cephalic version) during labor.  With epidural analgesia for pain relief. Consider where you would like to deliver your baby. VBAC should be attempted in facilities where an emergency cesarean delivery can be performed. VBAC is not recommended for home births. Any changes in your health or your baby's health during your pregnancy may make it necessary to change your initial decision about VBAC. Your health care provider may recommend that you do not attempt a VBAC if:  Your baby's suspected weight is 8.8 lb (4 kg) or more.  You have preeclampsia. This is a condition that causes high blood pressure along with other symptoms, such as swelling and headaches.  You will have VBAC less than 19 months after your cesarean delivery.  You are past your due date.  You need to have labor started (induced) because your cervix is not ready for labor (unfavorable). Where to find more information  American Pregnancy Association: americanpregnancy.org  American Congress of Obstetricians and Gynecologists: acog.org Summary  Vaginal birth after cesarean delivery (VBAC) is giving birth vaginally after previously delivering a baby through a cesarean section (C-section). A VBAC may be a safe option for you, depending on your health and other factors.  Discuss VBAC with your health care provider early in your pregnancy so you can understand the risks, benefits, options, and   have plenty of time to make your birth plan.  The main risk of attempting a VBAC is that it may fail, forcing your health care provider to deliver your baby by a C-section. Other risks are rare. This information is not intended to replace advice given to you by your health care provider. Make sure  you discuss any questions you have with your health care provider. Document Revised: 12/24/2018 Document Reviewed: 12/05/2016 Elsevier Patient Education  2021 Elsevier Inc.  

## 2020-11-11 ENCOUNTER — Ambulatory Visit: Payer: Medicaid Other

## 2020-11-11 LAB — STREP GP B NAA: Strep Gp B NAA: POSITIVE — AB

## 2020-11-11 LAB — CERVICOVAGINAL ANCILLARY ONLY
Chlamydia: NEGATIVE
Comment: NEGATIVE
Comment: NORMAL
Neisseria Gonorrhea: NEGATIVE

## 2020-11-16 ENCOUNTER — Ambulatory Visit (INDEPENDENT_AMBULATORY_CARE_PROVIDER_SITE_OTHER): Payer: Medicaid Other | Admitting: Obstetrics and Gynecology

## 2020-11-16 ENCOUNTER — Encounter: Payer: Self-pay | Admitting: Obstetrics and Gynecology

## 2020-11-16 ENCOUNTER — Other Ambulatory Visit: Payer: Self-pay

## 2020-11-16 VITALS — BP 115/72 | HR 98 | Wt 157.0 lb

## 2020-11-16 DIAGNOSIS — Z98891 History of uterine scar from previous surgery: Secondary | ICD-10-CM

## 2020-11-16 DIAGNOSIS — Z3480 Encounter for supervision of other normal pregnancy, unspecified trimester: Secondary | ICD-10-CM

## 2020-11-16 DIAGNOSIS — B3731 Acute candidiasis of vulva and vagina: Secondary | ICD-10-CM

## 2020-11-16 DIAGNOSIS — O98519 Other viral diseases complicating pregnancy, unspecified trimester: Secondary | ICD-10-CM

## 2020-11-16 DIAGNOSIS — U071 COVID-19: Secondary | ICD-10-CM

## 2020-11-16 DIAGNOSIS — B373 Candidiasis of vulva and vagina: Secondary | ICD-10-CM

## 2020-11-16 MED ORDER — TERCONAZOLE 0.4 % VA CREA: 1.0000 | TOPICAL_CREAM | Freq: Every day | VAGINAL | 0 refills | Status: DC

## 2020-11-16 NOTE — Progress Notes (Signed)
   PRENATAL VISIT NOTE  Subjective:  Gina Montoya is a 30 y.o. [id number] at [redacted]w[redacted]d being seen today for ongoing prenatal care.  She is currently monitored for the following issues for this low-risk pregnancy and has Post partum depression; Supervision of other normal pregnancy, antepartum; History of intrauterine growth restriction in prior pregnancy, currently pregnant; History of cesarean section; and COVID-19 affecting pregnancy, antepartum on their problem list.  Patient reports no complaints.  Contractions: Irregular. Vag. Bleeding: None.  Movement: Present. Denies leaking of fluid.   The following portions of the patient's history were reviewed and updated as appropriate: allergies, current medications, past family history, past medical history, past social history, past surgical history and problem list.   Objective:   Vitals:   11/16/20 1609  BP: 115/72  Pulse: 98  Weight: 157 lb (71.2 kg)    Fetal Status: Fetal Heart Rate (bpm): 155   Movement: Present     General:  Alert, oriented and cooperative. Patient is in no acute distress.  Skin: Skin is warm and dry. No rash noted.   Cardiovascular: Normal heart rate noted  Respiratory: Normal respiratory effort, no problems with respiration noted  Abdomen: Soft, gravid, appropriate for gestational age.  Pain/Pressure: Absent     Pelvic: Cervical exam deferred        Extremities: Normal range of motion.     Mental Status: Normal mood and affect. Normal behavior. Normal judgment and thought content.   Assessment and Plan:  Pregnancy: [id number] at [redacted]w[redacted]d 1. Supervision of other normal pregnancy, antepartum Patient is doing well without complaints Prophylaxis in labor for GBS carrier status  2. History of cesarean section Patient desires TOLAC  3. COVID-19 affecting pregnancy, antepartum Third trimester antenatal testing no longer indicated per MFM  Term labor symptoms and general obstetric precautions including but not  limited to vaginal bleeding, contractions, leaking of fluid and fetal movement were reviewed in detail with the patient. Please refer to After Visit Summary for other counseling recommendations.   Return in about 1 week (around 11/23/2020) for in person, ROB, High risk.  No future appointments.  Catalina Antigua, MD

## 2020-11-23 ENCOUNTER — Ambulatory Visit (INDEPENDENT_AMBULATORY_CARE_PROVIDER_SITE_OTHER): Payer: Medicaid Other | Admitting: Obstetrics and Gynecology

## 2020-11-23 ENCOUNTER — Other Ambulatory Visit: Payer: Self-pay

## 2020-11-23 VITALS — BP 103/68 | HR 98 | Wt 152.6 lb

## 2020-11-23 DIAGNOSIS — Z3A38 38 weeks gestation of pregnancy: Secondary | ICD-10-CM

## 2020-11-23 DIAGNOSIS — Z3480 Encounter for supervision of other normal pregnancy, unspecified trimester: Secondary | ICD-10-CM

## 2020-11-23 DIAGNOSIS — O09299 Supervision of pregnancy with other poor reproductive or obstetric history, unspecified trimester: Secondary | ICD-10-CM

## 2020-11-23 DIAGNOSIS — Z98891 History of uterine scar from previous surgery: Secondary | ICD-10-CM

## 2020-11-23 NOTE — Progress Notes (Signed)
   PRENATAL VISIT NOTE  Subjective:  Gina Montoya is a 30 y.o. [id number] at [redacted]w[redacted]d being seen today for ongoing prenatal care.  She is currently monitored for the following issues for this high-risk pregnancy and has Post partum depression; Supervision of other normal pregnancy, antepartum; History of intrauterine growth restriction in prior pregnancy, currently pregnant; History of cesarean section; COVID-19 affecting pregnancy, antepartum; and [redacted] weeks gestation of pregnancy on their problem list.  Patient doing well with no acute concerns today. She reports no complaints.  Contractions: Irritability. Vag. Bleeding: None.  Movement: Present. Denies leaking of fluid.   The following portions of the patient's history were reviewed and updated as appropriate: allergies, current medications, past family history, past medical history, past social history, past surgical history and problem list. Problem list updated.  Objective:   Vitals:   11/23/20 1614  BP: 103/68  Pulse: 98  Weight: 152 lb 9.6 oz (69.2 kg)    Fetal Status: Fetal Heart Rate (bpm): 145 Fundal Height: 38 cm Movement: Present     General:  Alert, oriented and cooperative. Patient is in no acute distress.  Skin: Skin is warm and dry. No rash noted.   Cardiovascular: Normal heart rate noted  Respiratory: Normal respiratory effort, no problems with respiration noted  Abdomen: Soft, gravid, appropriate for gestational age.  Pain/Pressure: Absent     Pelvic: Cervical exam deferred        Extremities: Normal range of motion.  Edema: None  Mental Status:  Normal mood and affect. Normal behavior. Normal judgment and thought content.   Assessment and Plan:  Pregnancy: [id number] at [redacted]w[redacted]d  1. History of cesarean section Pt desires TOLAC, IOL tentatively scheduled at 40 weeks  2. History of intrauterine growth restriction in prior pregnancy, currently pregnant No recent growth scans, fundal height is appropriate  3.  Supervision of other normal pregnancy, antepartum   4. [redacted] weeks gestation of pregnancy   Term labor symptoms and general obstetric precautions including but not limited to vaginal bleeding, contractions, leaking of fluid and fetal movement were reviewed in detail with the patient.  Please refer to After Visit Summary for other counseling recommendations.   Return in about 1 week (around 11/30/2020) for ROB, in person.   Mariel Aloe, MD [facility]

## 2020-11-23 NOTE — Patient Instructions (Signed)
Labor Induction Labor induction is when steps are taken to cause a pregnant woman to begin the labor process. Most women go into labor on their own between 37 weeks and 42 weeks of pregnancy. When this does not happen, or when there is a medical need for labor to begin, steps may be taken to induce, or bring on, labor. Labor induction causes a pregnant woman's uterus to contract. It also causes the cervix to soften (ripen), open (dilate), and thin out. Usually, labor is not induced before 39 weeks of pregnancy unless there is a medical reason to do so. When is labor induction considered? Labor induction may be right for you if:  Your pregnancy lasts longer than 41 to 42 weeks.  Your placenta is separating from your uterus (placental abruption).  You have a rupture of membranes and your labor does not begin.  You have health problems, like diabetes or high blood pressure (preeclampsia) during your pregnancy.  Your baby has stopped growing or does not have enough amniotic fluid. Before labor induction begins, your health care provider will consider the following factors:  Your medical condition and the baby's condition.  How many weeks you have been pregnant.  How mature the baby's lungs are.  The condition of your cervix.  The position of the baby.  The size of your birth canal. Tell a health care provider about:  Any allergies you have.  All medicines you are taking, including vitamins, herbs, eye drops, creams, and over-the-counter medicines.  Any problems you or your family members have had with anesthetic medicines.  Any surgeries you have had.  Any blood disorders you have.  Any medical conditions you have. What are the risks? Generally, this is a safe procedure. However, problems may occur, including:  Failed induction.  Changes in fetal heart rate, such as being too high, too low, or irregular (erratic).  Infection in the mother or the baby.  Increased risk of  having a cesarean delivery.  Breaking off (abruption) of the placenta from the uterus. This is rare.  Rupture of the uterus. This is very rare.  Your baby could fail to get enough blood flow or oxygen. This can be life-threatening. When induction is needed for medical reasons, the benefits generally outweigh the risks. What happens during the procedure? During the procedure, your health care provider will use one of these methods to induce labor:  Stripping the membranes. In this method, the amniotic sac tissue is gently separated from the cervix. This causes the following to happen: ? Your cervix stretches, which in turn causes the release of prostaglandins. ? Prostaglandins induce labor and cause the uterus to contract. ? This procedure is often done in an office visit. You will be sent home to wait for contractions to begin.  Prostaglandin medicine. This medicine starts contractions and causes the cervix to dilate and ripen. This can be taken by mouth (orally) or by being inserted into the vagina (suppository).  Inserting a small, thin tube (catheter) with a balloon into the vagina and then expanding the balloon with water to dilate the cervix.  Breaking the water. In this method, a small instrument is used to make a small hole in the amniotic sac. This eventually causes the amniotic sac to break. Contractions should begin within a few hours.  Medicine to trigger or strengthen contractions. This medicine is given through an IV that is inserted into a vein in your arm. This procedure may vary among health care providers and hospitals.     Where to find more information  [employer]: www.marchofdimes.org  [employer] of Obstetricians and Gynecologists: www.acog.org Summary  Labor induction causes a pregnant woman's uterus to contract. It also causes the cervix to soften (ripen), open (dilate), and thin out.  Labor is usually not induced before 39 weeks of pregnancy unless  there is a medical reason to do so.  When induction is needed for medical reasons, the benefits generally outweigh the risks.  Talk with your health care provider about which methods of labor induction are right for you. This information is not intended to replace advice given to you by your health care provider. Make sure you discuss any questions you have with your health care provider. Document Revised: 06/10/2020 Document Reviewed: 06/10/2020 Elsevier Patient Education  2021 Elsevier Inc. Vaginal Birth After Cesarean Delivery  Vaginal birth after cesarean delivery (VBAC) is giving birth vaginally after previously delivering a baby through a cesarean section (C-section). A VBAC may be a safe option for you, depending on your health and other factors. It is important to discuss VBAC with your health care provider early in your pregnancy so you can understand the risks, benefits, and options. Having these discussions early will give you time to make your birth plan. Who are the best candidates for VBAC? The best candidates for VBAC are women who:  Have had one or two prior cesarean deliveries, and the incision made during the delivery was horizontal (low transverse).  Do not have a vertical (classical) scar on their uterus.  Have not had a tear in the wall of their uterus (uterine rupture).  Plan to have more pregnancies. A VBAC is also more likely to be successful:  In women who have previously given birth vaginally.  When labor starts by itself (spontaneously) before the due date. What are the benefits of VBAC? The benefits of delivering your baby vaginally instead of by a cesarean delivery include:  A shorter hospital stay.  A faster recovery time.  Less pain.  Avoiding risks associated with major surgery, such as infection and blood clots.  Less blood loss and less need for donated blood (transfusions). What are the risks of VBAC? The main risk of attempting a VBAC is  that it may fail, forcing your health care provider to deliver your baby by a C-section. Other risks are rare and include:  Tearing (rupture) of the scar from a past cesarean delivery.  Other risks associated with vaginal deliveries. If a repeat cesarean delivery is needed, the risks include:  Blood loss.  Infection.  Blood clot.  Damage to surrounding organs.  Removal of the uterus (hysterectomy), if it is damaged.  Placenta problems in future pregnancies. What else should I know about my options? Delivering a baby through a VBAC is similar to having a normal spontaneous vaginal delivery. Therefore, it is safe:  To try with twins.  For your health care provider to try to turn the baby from a breech position (external cephalic version) during labor.  With epidural analgesia for pain relief. Consider where you would like to deliver your baby. VBAC should be attempted in facilities where an emergency cesarean delivery can be performed. VBAC is not recommended for home births. Any changes in your health or your baby's health during your pregnancy may make it necessary to change your initial decision about VBAC. Your health care provider may recommend that you do not attempt a VBAC if:  Your baby's suspected weight is 8.8 lb (4 kg) or  more.  You have preeclampsia. This is a condition that causes high blood pressure along with other symptoms, such as swelling and headaches.  You will have VBAC less than 19 months after your cesarean delivery.  You are past your due date.  You need to have labor started (induced) because your cervix is not ready for labor (unfavorable). Where to find more information  American Pregnancy Association: americanpregnancy.org  [employer] of Obstetricians and Gynecologists: acog.org Summary  Vaginal birth after cesarean delivery (VBAC) is giving birth vaginally after previously delivering a baby through a cesarean section (C-section). A VBAC  may be a safe option for you, depending on your health and other factors.  Discuss VBAC with your health care provider early in your pregnancy so you can understand the risks, benefits, options, and have plenty of time to make your birth plan.  The main risk of attempting a VBAC is that it may fail, forcing your health care provider to deliver your baby by a C-section. Other risks are rare. This information is not intended to replace advice given to you by your health care provider. Make sure you discuss any questions you have with your health care provider. Document Revised: 12/24/2018 Document Reviewed: 12/05/2016 Elsevier Patient Education  2021 [employer].

## 2020-11-23 NOTE — Progress Notes (Signed)
Patient presents for ROB. Patient has no concerns today. 

## 2020-11-24 ENCOUNTER — Encounter (HOSPITAL_COMMUNITY): Payer: Self-pay | Admitting: *Deleted

## 2020-11-24 ENCOUNTER — Telehealth (HOSPITAL_COMMUNITY): Payer: Self-pay | Admitting: *Deleted

## 2020-11-24 NOTE — Telephone Encounter (Signed)
Preadmission screen  

## 2020-11-25 ENCOUNTER — Other Ambulatory Visit: Payer: Self-pay | Admitting: Advanced Practice Midwife

## 2020-11-25 ENCOUNTER — Other Ambulatory Visit (INDEPENDENT_AMBULATORY_CARE_PROVIDER_SITE_OTHER): Payer: Medicaid Other | Admitting: Obstetrics and Gynecology

## 2020-11-25 NOTE — Progress Notes (Signed)
IOL labs entered

## 2020-11-30 ENCOUNTER — Ambulatory Visit (INDEPENDENT_AMBULATORY_CARE_PROVIDER_SITE_OTHER): Payer: Medicaid Other | Admitting: Obstetrics and Gynecology

## 2020-11-30 ENCOUNTER — Other Ambulatory Visit: Payer: Self-pay

## 2020-11-30 ENCOUNTER — Encounter: Payer: Self-pay | Admitting: Obstetrics and Gynecology

## 2020-11-30 VITALS — BP 106/72 | HR 90 | Wt 154.0 lb

## 2020-11-30 DIAGNOSIS — U071 COVID-19: Secondary | ICD-10-CM

## 2020-11-30 DIAGNOSIS — O09299 Supervision of pregnancy with other poor reproductive or obstetric history, unspecified trimester: Secondary | ICD-10-CM

## 2020-11-30 DIAGNOSIS — Z3480 Encounter for supervision of other normal pregnancy, unspecified trimester: Secondary | ICD-10-CM

## 2020-11-30 DIAGNOSIS — Z3A39 39 weeks gestation of pregnancy: Secondary | ICD-10-CM

## 2020-11-30 DIAGNOSIS — O98519 Other viral diseases complicating pregnancy, unspecified trimester: Secondary | ICD-10-CM

## 2020-11-30 DIAGNOSIS — Z98891 History of uterine scar from previous surgery: Secondary | ICD-10-CM

## 2020-11-30 NOTE — Progress Notes (Signed)
   PRENATAL VISIT NOTE  Subjective:  Gina Montoya is a 30 y.o. [id number] at [redacted]w[redacted]d being seen today for ongoing prenatal care.  She is currently monitored for the following issues for this high-risk pregnancy and has Post partum depression; Supervision of other normal pregnancy, antepartum; History of intrauterine growth restriction in prior pregnancy, currently pregnant; History of cesarean section; COVID-19 affecting pregnancy, antepartum; and [redacted] weeks gestation of pregnancy on their problem list.  Patient reports no complaints.  Contractions: Irritability. Vag. Bleeding: None.  Movement: Present. Denies leaking of fluid.   The following portions of the patient's history were reviewed and updated as appropriate: allergies, current medications, past family history, past medical history, past social history, past surgical history and problem list.   Objective:   Vitals:   11/30/20 1608  BP: 106/72  Pulse: 90  Weight: 154 lb (69.9 kg)    Fetal Status: Fetal Heart Rate (bpm): 134   Movement: Present     General:  Alert, oriented and cooperative. Patient is in no acute distress.  Skin: Skin is warm and dry. No rash noted.   Cardiovascular: Normal heart rate noted  Respiratory: Normal respiratory effort, no problems with respiration noted  Abdomen: Soft, gravid, appropriate for gestational age.  Pain/Pressure: Present     Pelvic: Cervical exam performed in the presence of a chaperone Dilation: Fingertip Effacement (%): 30 Station: Ballotable  Extremities: Normal range of motion.  Edema: None  Mental Status: Normal mood and affect. Normal behavior. Normal judgment and thought content.   Assessment and Plan:  Pregnancy: [id number] at [redacted]w[redacted]d 1. Supervision of other normal pregnancy, antepartum  2. History of intrauterine growth restriction in prior pregnancy, currently pregnant Last growth 30%tile  3. History of cesarean section For TOLAC  4. COVID-19 affecting pregnancy,  antepartum 3rd trim,   5. [redacted] weeks gestation of pregnancy   Term labor symptoms and general obstetric precautions including but not limited to vaginal bleeding, contractions, leaking of fluid and fetal movement were reviewed in detail with the patient. Please refer to After Visit Summary for other counseling recommendations.   Return in about 5 weeks (around 01/04/2021) for post partum check.  Future Appointments  Date Time Provider Department Center  12/02/2020  6:55 AM [facility] SCHED ROOM [facility] None    Conan Bowens, MD

## 2020-11-30 NOTE — Progress Notes (Signed)
ROB [redacted]w[redacted]d   Cervix check: desires

## 2020-12-01 ENCOUNTER — Other Ambulatory Visit: Payer: Self-pay | Admitting: Advanced Practice Midwife

## 2020-12-02 ENCOUNTER — Encounter (HOSPITAL_COMMUNITY): Payer: Self-pay | Admitting: Obstetrics and Gynecology

## 2020-12-02 ENCOUNTER — Inpatient Hospital Stay (HOSPITAL_COMMUNITY): Payer: Medicaid Other | Admitting: Anesthesiology

## 2020-12-02 ENCOUNTER — Inpatient Hospital Stay (HOSPITAL_COMMUNITY): Payer: Medicaid Other

## 2020-12-02 ENCOUNTER — Encounter (HOSPITAL_COMMUNITY)
Admission: AD | Disposition: A | Discharge status: Home / Self Care | Payer: Self-pay | Source: Home / Self Care | Attending: Obstetrics & Gynecology

## 2020-12-02 ENCOUNTER — Inpatient Hospital Stay (HOSPITAL_COMMUNITY)
Admission: AD | Admit: 2020-12-02 | Discharge: 2020-12-04 | DRG: 788 | Disposition: A | Discharge status: Home / Self Care | Payer: Medicaid Other | Attending: Obstetrics & Gynecology | Admitting: Obstetrics & Gynecology

## 2020-12-02 ENCOUNTER — Other Ambulatory Visit: Payer: Self-pay

## 2020-12-02 DIAGNOSIS — Z3A40 40 weeks gestation of pregnancy: Secondary | ICD-10-CM | POA: Diagnosis not present

## 2020-12-02 DIAGNOSIS — O09299 Supervision of pregnancy with other poor reproductive or obstetric history, unspecified trimester: Secondary | ICD-10-CM

## 2020-12-02 DIAGNOSIS — O26893 Other specified pregnancy related conditions, third trimester: Secondary | ICD-10-CM | POA: Diagnosis not present

## 2020-12-02 DIAGNOSIS — O34211 Maternal care for low transverse scar from previous cesarean delivery: Secondary | ICD-10-CM | POA: Diagnosis not present

## 2020-12-02 DIAGNOSIS — O368330 Maternal care for abnormalities of the fetal heart rate or rhythm, third trimester, not applicable or unspecified: Secondary | ICD-10-CM | POA: Diagnosis not present

## 2020-12-02 DIAGNOSIS — Z8616 Personal history of COVID-19: Secondary | ICD-10-CM | POA: Diagnosis not present

## 2020-12-02 DIAGNOSIS — O99824 Streptococcus B carrier state complicating childbirth: Secondary | ICD-10-CM | POA: Diagnosis present

## 2020-12-02 DIAGNOSIS — O99345 Other mental disorders complicating the puerperium: Secondary | ICD-10-CM | POA: Diagnosis present

## 2020-12-02 DIAGNOSIS — Z3490 Encounter for supervision of normal pregnancy, unspecified, unspecified trimester: Secondary | ICD-10-CM | POA: Diagnosis present

## 2020-12-02 DIAGNOSIS — O98519 Other viral diseases complicating pregnancy, unspecified trimester: Secondary | ICD-10-CM | POA: Diagnosis present

## 2020-12-02 DIAGNOSIS — F53 Postpartum depression: Secondary | ICD-10-CM | POA: Diagnosis present

## 2020-12-02 DIAGNOSIS — O323XX0 Maternal care for face, brow and chin presentation, not applicable or unspecified: Secondary | ICD-10-CM | POA: Diagnosis not present

## 2020-12-02 DIAGNOSIS — U071 COVID-19: Secondary | ICD-10-CM | POA: Diagnosis present

## 2020-12-02 DIAGNOSIS — Z98891 History of uterine scar from previous surgery: Secondary | ICD-10-CM

## 2020-12-02 LAB — CBC
HCT: 33.2 % — ABNORMAL LOW (ref 36.0–46.0)
Hemoglobin: 11 g/dL — ABNORMAL LOW (ref 12.0–15.0)
MCH: 27.6 pg (ref 26.0–34.0)
MCHC: 33.1 g/dL (ref 30.0–36.0)
MCV: 83.4 fL (ref 80.0–100.0)
Platelets: 173 10*3/uL (ref 150–400)
RBC: 3.98 MIL/uL (ref 3.87–5.11)
RDW: 14.7 % (ref 11.5–15.5)
WBC: 6.4 10*3/uL (ref 4.0–10.5)
nRBC: 0 % (ref 0.0–0.2)

## 2020-12-02 LAB — TYPE AND SCREEN
ABO/RH(D): AB POS
Antibody Screen: NEGATIVE

## 2020-12-02 LAB — RPR: RPR Ser Ql: NONREACTIVE

## 2020-12-02 SURGERY — Surgical Case
Anesthesia: Epidural

## 2020-12-02 MED ORDER — SODIUM CHLORIDE 0.9 % IV SOLN
5.0000 10*6.[IU] | Freq: Once | INTRAVENOUS | Status: AC
  Administered 2020-12-02: 5 10*6.[IU] via INTRAVENOUS
  Filled 2020-12-02: qty 5

## 2020-12-02 MED ORDER — OXYTOCIN-SODIUM CHLORIDE 30-0.9 UT/500ML-% IV SOLN
INTRAVENOUS | Status: AC
  Filled 2020-12-02: qty 500

## 2020-12-02 MED ORDER — MEPERIDINE HCL 25 MG/ML IJ SOLN
INTRAMUSCULAR | Status: AC
  Filled 2020-12-02: qty 1

## 2020-12-02 MED ORDER — KETOROLAC TROMETHAMINE 30 MG/ML IJ SOLN: 30.0000 mg | Freq: Once | INTRAMUSCULAR | Status: DC

## 2020-12-02 MED ORDER — WITCH HAZEL-GLYCERIN EX PADS: 1.0000 "application " | MEDICATED_PAD | CUTANEOUS | Status: DC | PRN

## 2020-12-02 MED ORDER — TETANUS-DIPHTH-ACELL PERTUSSIS 5-2.5-18.5 LF-MCG/0.5 IM SUSY: 0.5000 mL | PREFILLED_SYRINGE | Freq: Once | INTRAMUSCULAR | Status: DC

## 2020-12-02 MED ORDER — ACETAMINOPHEN 325 MG PO TABS
650.0000 mg | ORAL_TABLET | ORAL | Status: DC | PRN
  Administered 2020-12-03 – 2020-12-04 (×4): 650 mg via ORAL
  Filled 2020-12-02 (×4): qty 2

## 2020-12-02 MED ORDER — OXYTOCIN-SODIUM CHLORIDE 30-0.9 UT/500ML-% IV SOLN: 2.5000 [IU]/h | INTRAVENOUS | Status: DC

## 2020-12-02 MED ORDER — DEXAMETHASONE SODIUM PHOSPHATE 4 MG/ML IJ SOLN
INTRAMUSCULAR | Status: AC
  Filled 2020-12-02: qty 1

## 2020-12-02 MED ORDER — OXYTOCIN BOLUS FROM INFUSION: 333.0000 mL | Freq: Once | INTRAVENOUS | Status: DC

## 2020-12-02 MED ORDER — FENTANYL CITRATE (PF) 100 MCG/2ML IJ SOLN
INTRAMUSCULAR | Status: AC
  Filled 2020-12-02: qty 2

## 2020-12-02 MED ORDER — PROMETHAZINE HCL 25 MG/ML IJ SOLN: 6.2500 mg | INTRAMUSCULAR | Status: DC | PRN

## 2020-12-02 MED ORDER — DIPHENHYDRAMINE HCL 25 MG PO CAPS
25.0000 mg | ORAL_CAPSULE | Freq: Four times a day (QID) | ORAL | Status: DC | PRN
  Administered 2020-12-02 – 2020-12-03 (×2): 25 mg via ORAL
  Filled 2020-12-02 (×2): qty 1

## 2020-12-02 MED ORDER — MORPHINE SULFATE (PF) 0.5 MG/ML IJ SOLN
INTRAMUSCULAR | Status: DC | PRN
  Administered 2020-12-02: 2 mg via EPIDURAL
  Administered 2020-12-02: 3 mg via EPIDURAL

## 2020-12-02 MED ORDER — IBUPROFEN 800 MG PO TABS
800.0000 mg | ORAL_TABLET | Freq: Four times a day (QID) | ORAL | Status: DC
  Administered 2020-12-03 – 2020-12-04 (×4): 800 mg via ORAL
  Filled 2020-12-02 (×4): qty 1

## 2020-12-02 MED ORDER — SODIUM CHLORIDE 0.9 % IV SOLN: INTRAVENOUS | Status: DC | PRN

## 2020-12-02 MED ORDER — ACETAMINOPHEN 325 MG PO TABS: 650.0000 mg | ORAL_TABLET | ORAL | Status: DC | PRN

## 2020-12-02 MED ORDER — LACTATED RINGERS IV SOLN
500.0000 mL | Freq: Once | INTRAVENOUS | Status: AC
  Administered 2020-12-02: 500 mL via INTRAVENOUS

## 2020-12-02 MED ORDER — SIMETHICONE 80 MG PO CHEW
80.0000 mg | CHEWABLE_TABLET | Freq: Three times a day (TID) | ORAL | Status: DC
  Administered 2020-12-03 – 2020-12-04 (×4): 80 mg via ORAL
  Filled 2020-12-02 (×4): qty 1

## 2020-12-02 MED ORDER — MEASLES, MUMPS & RUBELLA VAC IJ SOLR: 0.5000 mL | Freq: Once | INTRAMUSCULAR | Status: DC

## 2020-12-02 MED ORDER — CEFAZOLIN SODIUM-DEXTROSE 2-4 GM/100ML-% IV SOLN
2.0000 g | INTRAVENOUS | Status: AC
  Administered 2020-12-02: 2 g via INTRAVENOUS

## 2020-12-02 MED ORDER — TERBUTALINE SULFATE 1 MG/ML IJ SOLN
0.2500 mg | Freq: Once | INTRAMUSCULAR | Status: AC | PRN
  Administered 2020-12-02: 0.25 mg via SUBCUTANEOUS
  Filled 2020-12-02: qty 1

## 2020-12-02 MED ORDER — LACTATED RINGERS IV SOLN
500.0000 mL | INTRAVENOUS | Status: DC | PRN
  Administered 2020-12-02: 500 mL via INTRAVENOUS

## 2020-12-02 MED ORDER — SODIUM CHLORIDE 0.9 % IR SOLN
Status: DC | PRN
  Administered 2020-12-02: 1000 mL

## 2020-12-02 MED ORDER — OXYCODONE HCL 5 MG/5ML PO SOLN
5.0000 mg | Freq: Once | ORAL | Status: DC | PRN
Start: 2020-12-02 — End: 2020-12-02

## 2020-12-02 MED ORDER — OXYTOCIN-SODIUM CHLORIDE 30-0.9 UT/500ML-% IV SOLN
INTRAVENOUS | Status: DC | PRN
  Administered 2020-12-02: 30 [IU] via INTRAVENOUS

## 2020-12-02 MED ORDER — FENTANYL CITRATE (PF) 100 MCG/2ML IJ SOLN
INTRAMUSCULAR | Status: DC | PRN
  Administered 2020-12-02: 100 ug via EPIDURAL

## 2020-12-02 MED ORDER — PRENATAL MULTIVITAMIN CH
1.0000 | ORAL_TABLET | Freq: Every day | ORAL | Status: DC
  Administered 2020-12-03 – 2020-12-04 (×2): 1 via ORAL
  Filled 2020-12-02 (×2): qty 1

## 2020-12-02 MED ORDER — MENTHOL 3 MG MT LOZG: 1.0000 | LOZENGE | OROMUCOSAL | Status: DC | PRN

## 2020-12-02 MED ORDER — DIBUCAINE (PERIANAL) 1 % EX OINT
1.0000 | TOPICAL_OINTMENT | CUTANEOUS | Status: DC | PRN
Start: 2020-12-02 — End: 2020-12-04

## 2020-12-02 MED ORDER — FENTANYL CITRATE (PF) 100 MCG/2ML IJ SOLN: 50.0000 ug | INTRAMUSCULAR | Status: DC | PRN

## 2020-12-02 MED ORDER — OXYTOCIN-SODIUM CHLORIDE 30-0.9 UT/500ML-% IV SOLN
1.0000 m[IU]/min | INTRAVENOUS | Status: DC
  Administered 2020-12-02: 1 m[IU]/min via INTRAVENOUS

## 2020-12-02 MED ORDER — HYDROMORPHONE HCL 1 MG/ML IJ SOLN
INTRAMUSCULAR | Status: AC
  Filled 2020-12-02: qty 0.5

## 2020-12-02 MED ORDER — HYDROXYZINE HCL 50 MG PO TABS: 50.0000 mg | ORAL_TABLET | Freq: Four times a day (QID) | ORAL | Status: DC | PRN

## 2020-12-02 MED ORDER — FENTANYL-BUPIVACAINE-NACL 0.5-0.125-0.9 MG/250ML-% EP SOLN
12.0000 mL/h | EPIDURAL | Status: DC | PRN
Start: 2020-12-02 — End: 2020-12-02
  Administered 2020-12-02: 12 mL/h via EPIDURAL
  Filled 2020-12-02: qty 250

## 2020-12-02 MED ORDER — ENOXAPARIN SODIUM 40 MG/0.4ML ~~LOC~~ SOLN
40.0000 mg | SUBCUTANEOUS | Status: DC
  Administered 2020-12-03 – 2020-12-04 (×2): 40 mg via SUBCUTANEOUS
  Filled 2020-12-02 (×2): qty 0.4

## 2020-12-02 MED ORDER — MEDROXYPROGESTERONE ACETATE 150 MG/ML IM SUSP: 150.0000 mg | Freq: Once | INTRAMUSCULAR | Status: DC

## 2020-12-02 MED ORDER — DIPHENHYDRAMINE HCL 50 MG/ML IJ SOLN: 12.5000 mg | INTRAMUSCULAR | Status: DC | PRN

## 2020-12-02 MED ORDER — OXYCODONE HCL 5 MG PO TABS
5.0000 mg | ORAL_TABLET | ORAL | Status: DC | PRN
  Administered 2020-12-03 – 2020-12-04 (×5): 5 mg via ORAL
  Filled 2020-12-02 (×5): qty 1

## 2020-12-02 MED ORDER — TRANEXAMIC ACID-NACL 1000-0.7 MG/100ML-% IV SOLN
INTRAVENOUS | Status: DC | PRN
  Administered 2020-12-02: 1000 mg via INTRAVENOUS

## 2020-12-02 MED ORDER — PENICILLIN G POT IN DEXTROSE 60000 UNIT/ML IV SOLN
3.0000 10*6.[IU] | INTRAVENOUS | Status: DC
  Administered 2020-12-02 (×2): 3 10*6.[IU] via INTRAVENOUS
  Filled 2020-12-02 (×2): qty 50

## 2020-12-02 MED ORDER — SOD CITRATE-CITRIC ACID 500-334 MG/5ML PO SOLN: 30.0000 mL | ORAL | Status: DC

## 2020-12-02 MED ORDER — CEFAZOLIN SODIUM-DEXTROSE 2-4 GM/100ML-% IV SOLN
INTRAVENOUS | Status: AC
  Filled 2020-12-02: qty 100

## 2020-12-02 MED ORDER — SIMETHICONE 80 MG PO CHEW: 80.0000 mg | CHEWABLE_TABLET | ORAL | Status: DC | PRN

## 2020-12-02 MED ORDER — KETOROLAC TROMETHAMINE 30 MG/ML IJ SOLN
INTRAMUSCULAR | Status: DC | PRN
  Administered 2020-12-02: 30 mg via INTRAVENOUS

## 2020-12-02 MED ORDER — ONDANSETRON HCL 4 MG/2ML IJ SOLN
INTRAMUSCULAR | Status: DC | PRN
  Administered 2020-12-02: 4 mg via INTRAVENOUS

## 2020-12-02 MED ORDER — OXYTOCIN-SODIUM CHLORIDE 30-0.9 UT/500ML-% IV SOLN
1.0000 m[IU]/min | INTRAVENOUS | Status: DC
  Filled 2020-12-02: qty 500

## 2020-12-02 MED ORDER — MORPHINE SULFATE (PF) 0.5 MG/ML IJ SOLN
INTRAMUSCULAR | Status: AC
  Filled 2020-12-02: qty 10

## 2020-12-02 MED ORDER — SOD CITRATE-CITRIC ACID 500-334 MG/5ML PO SOLN
30.0000 mL | ORAL | Status: DC | PRN
  Filled 2020-12-02: qty 15

## 2020-12-02 MED ORDER — LIDOCAINE HCL (PF) 1 % IJ SOLN
INTRAMUSCULAR | Status: DC | PRN
  Administered 2020-12-02: 11 mL via EPIDURAL

## 2020-12-02 MED ORDER — HYDROMORPHONE HCL 1 MG/ML IJ SOLN
0.2500 mg | INTRAMUSCULAR | Status: DC | PRN
  Administered 2020-12-02: 0.5 mg via INTRAVENOUS

## 2020-12-02 MED ORDER — METHYLERGONOVINE MALEATE 0.2 MG/ML IJ SOLN
INTRAMUSCULAR | Status: AC
  Filled 2020-12-02: qty 1

## 2020-12-02 MED ORDER — EPHEDRINE 5 MG/ML INJ: 10.0000 mg | INTRAVENOUS | Status: DC | PRN

## 2020-12-02 MED ORDER — FENTANYL CITRATE (PF) 100 MCG/2ML IJ SOLN
INTRAMUSCULAR | Status: DC | PRN
  Administered 2020-12-02: 100 ug via INTRAVENOUS

## 2020-12-02 MED ORDER — KETOROLAC TROMETHAMINE 30 MG/ML IJ SOLN: 30.0000 mg | Freq: Once | INTRAMUSCULAR | Status: DC | PRN

## 2020-12-02 MED ORDER — PHENYLEPHRINE 40 MCG/ML (10ML) SYRINGE FOR IV PUSH (FOR BLOOD PRESSURE SUPPORT): 80.0000 ug | PREFILLED_SYRINGE | INTRAVENOUS | Status: DC | PRN

## 2020-12-02 MED ORDER — ONDANSETRON HCL 4 MG/2ML IJ SOLN: 4.0000 mg | Freq: Four times a day (QID) | INTRAMUSCULAR | Status: DC | PRN

## 2020-12-02 MED ORDER — SODIUM CHLORIDE 0.9 % IV SOLN
500.0000 mg | Freq: Once | INTRAVENOUS | Status: AC
  Administered 2020-12-02: 500 mg via INTRAVENOUS

## 2020-12-02 MED ORDER — OXYCODONE HCL 5 MG PO TABS: 5.0000 mg | ORAL_TABLET | Freq: Once | ORAL | Status: DC | PRN

## 2020-12-02 MED ORDER — MEPERIDINE HCL 25 MG/ML IJ SOLN
INTRAMUSCULAR | Status: DC | PRN
  Administered 2020-12-02: 25 mg via INTRAVENOUS

## 2020-12-02 MED ORDER — OXYTOCIN-SODIUM CHLORIDE 30-0.9 UT/500ML-% IV SOLN: 2.5000 [IU]/h | INTRAVENOUS | Status: AC

## 2020-12-02 MED ORDER — DIPHENHYDRAMINE HCL 50 MG/ML IJ SOLN
INTRAMUSCULAR | Status: AC
  Filled 2020-12-02: qty 1

## 2020-12-02 MED ORDER — SENNOSIDES-DOCUSATE SODIUM 8.6-50 MG PO TABS
2.0000 | ORAL_TABLET | ORAL | Status: DC
  Administered 2020-12-03: 2 via ORAL
  Filled 2020-12-02 (×3): qty 2

## 2020-12-02 MED ORDER — KETOROLAC TROMETHAMINE 30 MG/ML IJ SOLN
30.0000 mg | Freq: Four times a day (QID) | INTRAMUSCULAR | Status: AC
  Administered 2020-12-02 – 2020-12-03 (×3): 30 mg via INTRAVENOUS
  Filled 2020-12-02 (×4): qty 1

## 2020-12-02 MED ORDER — LACTATED RINGERS IV SOLN: INTRAVENOUS | Status: DC

## 2020-12-02 MED ORDER — COCONUT OIL OIL
1.0000 "application " | TOPICAL_OIL | Status: DC | PRN
  Administered 2020-12-02: 1 via TOPICAL

## 2020-12-02 MED ORDER — ONDANSETRON HCL 4 MG/2ML IJ SOLN
INTRAMUSCULAR | Status: AC
  Filled 2020-12-02: qty 2

## 2020-12-02 MED ORDER — METHYLERGONOVINE MALEATE 0.2 MG/ML IJ SOLN
INTRAMUSCULAR | Status: DC | PRN
Start: 2020-12-02 — End: 2020-12-02
  Administered 2020-12-02: .2 mg via INTRAMUSCULAR

## 2020-12-02 MED ORDER — LIDOCAINE HCL (PF) 1 % IJ SOLN: 30.0000 mL | INTRAMUSCULAR | Status: DC | PRN

## 2020-12-02 MED ORDER — OXYCODONE-ACETAMINOPHEN 5-325 MG PO TABS
1.0000 | ORAL_TABLET | ORAL | Status: DC | PRN
Start: 2020-12-02 — End: 2020-12-02

## 2020-12-02 SURGICAL SUPPLY — 38 items
BENZOIN TINCTURE PRP APPL 2/3 (GAUZE/BANDAGES/DRESSINGS) ×2 IMPLANT
CHLORAPREP W/TINT 26ML (MISCELLANEOUS) ×2 IMPLANT
CLAMP CORD UMBIL (MISCELLANEOUS) IMPLANT
CLOTH BEACON ORANGE TIMEOUT ST (SAFETY) ×2 IMPLANT
DERMABOND ADVANCED (GAUZE/BANDAGES/DRESSINGS)
DERMABOND ADVANCED .7 DNX12 (GAUZE/BANDAGES/DRESSINGS) IMPLANT
DRSG OPSITE POSTOP 4X10 (GAUZE/BANDAGES/DRESSINGS) ×2 IMPLANT
ELECT REM PT RETURN 9FT ADLT (ELECTROSURGICAL) ×2
ELECTRODE REM PT RTRN 9FT ADLT (ELECTROSURGICAL) ×1 IMPLANT
EXTRACTOR VACUUM KIWI (MISCELLANEOUS) IMPLANT
GLOVE BIOGEL PI IND STRL 7.0 (GLOVE) ×3 IMPLANT
GLOVE BIOGEL PI INDICATOR 7.0 (GLOVE) ×3
GLOVE ECLIPSE 6.5 STRL STRAW (GLOVE) ×2 IMPLANT
GOWN STRL REUS W/TWL LRG LVL3 (GOWN DISPOSABLE) ×6 IMPLANT
HEMOSTAT SURGICEL 4X8 (HEMOSTASIS) ×2 IMPLANT
KIT ABG SYR 3ML LUER SLIP (SYRINGE) IMPLANT
NEEDLE HYPO 25X5/8 SAFETYGLIDE (NEEDLE) IMPLANT
NS IRRIG 1000ML POUR BTL (IV SOLUTION) ×2 IMPLANT
PACK C SECTION WH (CUSTOM PROCEDURE TRAY) ×2 IMPLANT
PAD ABD 7.5X8 STRL (GAUZE/BANDAGES/DRESSINGS) ×2 IMPLANT
PAD OB MATERNITY 4.3X12.25 (PERSONAL CARE ITEMS) ×2 IMPLANT
PENCIL SMOKE EVAC W/HOLSTER (ELECTROSURGICAL) ×2 IMPLANT
RTRCTR C-SECT PINK 25CM LRG (MISCELLANEOUS) ×2 IMPLANT
STRIP CLOSURE SKIN 1/2X4 (GAUZE/BANDAGES/DRESSINGS) ×2 IMPLANT
SUT PLAIN 0 NONE (SUTURE) IMPLANT
SUT PLAIN 2 0 XLH (SUTURE) IMPLANT
SUT VIC AB 0 CT1 27 (SUTURE) ×2
SUT VIC AB 0 CT1 27XBRD ANBCTR (SUTURE) ×2 IMPLANT
SUT VIC AB 0 CTX 36 (SUTURE) ×5
SUT VIC AB 0 CTX36XBRD ANBCTRL (SUTURE) ×5 IMPLANT
SUT VIC AB 2-0 CT1 27 (SUTURE) ×1
SUT VIC AB 2-0 CT1 TAPERPNT 27 (SUTURE) ×1 IMPLANT
SUT VIC AB 4-0 KS 27 (SUTURE) ×2 IMPLANT
TOWEL OR 17X24 6PK STRL BLUE (TOWEL DISPOSABLE) ×2 IMPLANT
TRAY FOLEY W/BAG SLVR 14FR LF (SET/KITS/TRAYS/PACK) IMPLANT
TUBING NON-CON 1/4 X 20 CONN (TUBING) ×2 IMPLANT
WATER STERILE IRR 1000ML POUR (IV SOLUTION) ×2 IMPLANT
YANKAUER SUCT BULB TIP NO VENT (SUCTIONS) ×2 IMPLANT

## 2020-12-02 NOTE — Anesthesia Postprocedure Evaluation (Signed)
Anesthesia Post Note  Patient: Gina Montoya  Procedure(s) Performed: CESAREAN SECTION (N/A )     Patient location during evaluation: PACU Anesthesia Type: Epidural Level of consciousness: oriented and awake and alert Pain management: pain level controlled Vital Signs Assessment: post-procedure vital signs reviewed and stable Respiratory status: spontaneous breathing, respiratory function stable and patient connected to nasal cannula oxygen Cardiovascular status: blood pressure returned to baseline and stable Postop Assessment: no headache, no backache, no apparent nausea or vomiting and epidural receding Anesthetic complications: no   No complications documented.  Last Vitals:  Vitals:   12/02/20 2000 12/02/20 2015  BP: 113/73 115/73  Pulse: (!) 111 (!) 106  Resp: (!) 26 14  Temp:    SpO2: 100% 99%    Last Pain:  Vitals:   12/02/20 2000  TempSrc:   PainSc: 0-No pain   Pain Goal: Patients Stated Pain Goal: 0 (12/02/20 1146)  LLE Motor Response: Purposeful movement (12/02/20 2000) LLE Sensation: Full sensation (12/02/20 2000) RLE Motor Response: Purposeful movement (12/02/20 2000) RLE Sensation: Full sensation (12/02/20 2000)     Epidural/Spinal Function Cutaneous sensation: Normal sensation (12/02/20 2000), Patient able to flex knees: Yes (12/02/20 2000), Patient able to lift hips off bed: No (12/02/20 2000), Back pain beyond tenderness at insertion site: No (12/02/20 2000), Progressively worsening motor and/or sensory loss: No (12/02/20 2000)  Shelton Silvas

## 2020-12-02 NOTE — Progress Notes (Signed)
Labor Progress Note Mieko Kneebone is a 30 y.o. I1C3013 at [redacted]w[redacted]d presented for eIOL, TOLAC. Previous FHR decel after FB placement.  S:  Comfortable, feeling a few ctx.  O:  BP 110/64   Pulse 84   Temp 98.8 F (37.1 C) (Axillary)   Resp 16   Ht 5\' 2"  (1.575 m)   Wt 69.1 kg   LMP 02/14/2020 (Exact Date)   BMI 27.87 kg/m  EFM: baseline 145 bpm/ mod variability/ + accels/ early decels  Toco/IUPC: irreg SVE: Dilation: 1 Effacement (%): 50 Station: Ballotable Presentation: Undeterminable (bedside u/s confirmed cephalic) Exam by:: m  A/P: 30 y.o. 26 [redacted]w[redacted]d  1. Labor: latent 2. FWB: Cat I 3. Pain: analgesia/anesthesia prn 4. TOLAC  FHR recovered after decel. Offered replacement of FB vs Pitocin vs RCS. Pt initially preferred Pitocin then decided on FB. FB placed w/o difficulty.  Will monitor closely. Consider Pitocin once FB is out if indicated. Unclear MOD at this time.   [redacted]w[redacted]d, CNM 11:45 AM

## 2020-12-02 NOTE — Progress Notes (Addendum)
Labor Progress Note Gina Montoya is a 30 y.o. [id number] at [redacted]w[redacted]d presented for eIOL, TOLAC  Pt consented for FB placement and plan for low dose Pitocin Immediately after FB placed FHR noted into the 60s, BSUS to confirm Pt repositioned into lt lateral and IVF bolus started FHR remains in the 70s and pt placed into rt lateral, Gina Montoya notified and enroute Pt then placed in trendelenburg and Gina Montoya and Gina Montoya at bedside FHR improved and 120s; total decel time ~7-8 min Will monitor closely and attempt FB placement after allowing for fetal recovery  Donette Larry, CNM 10:36 AM

## 2020-12-02 NOTE — Discharge Summary (Signed)
Postpartum Discharge Summary    Patient Name: Gina Montoya DOB: 07/18/1991 MRN: [id number]  Date of admission: 12/02/2020 Delivery date:12/02/2020  Delivering provider: Janyth Pupa  Date of discharge: 12/04/2020  Admitting diagnosis: Encounter for elective induction of labor [Z34.90] Intrauterine pregnancy: [redacted]w[redacted]d    Secondary diagnosis:  Principal Problem:   Cesarean delivery delivered Active Problems:   Post partum depression   History of intrauterine growth restriction in prior pregnancy, currently pregnant   History of cesarean section   COVID-19 affecting pregnancy, antepartum   Encounter for elective induction of labor  Additional problems: as noted above  Discharge diagnosis: Cesarean delivery delivered                                         Post partum procedures: depo Augmentation: AROM, Pitocin and IP Foley Complications: fetal intolerance of labor  Hospital course: Induction of Labor With Cesarean Section   30y.o. yo [id number] admitted to the hospital 12/02/2020 for induction of labor. Patient had a labor course significant for elective IOL [facility] due to fetal intolerance of labor (non-reassuring fetal heart tones at 6cm cervical dilation). Delivery details are as follows: Membrane Rupture Time/Date: 6:17 PM ,12/02/2020   Delivery Method:C-Section, Low Transverse  Details of operation can be found in separate operative Note.  Patient had an uncomplicated postpartum course. She is ambulating, tolerating a regular diet, passing flatus, and urinating well.  Patient is discharged home in stable condition on 12/04/20.      Newborn Data: Birth date:12/02/2020  Birth time:6:17 PM  Gender:Female  Living status:Living  Apgars:9 ,9  Weight:  2810g                               Magnesium Sulfate received: No BMZ received: No Rhophylac:N/A MMR:N/A T-DaP: Offered prior to discharge Flu: Offered  prior to discharge Transfusion:No  Physical exam  Vitals:   12/02/20 1703 12/02/20 1730 12/02/20 1917 12/02/20 1930  BP: 100/67 106/72 (!) 110/49 132/79  Pulse: 97 81 (!) 115 (!) 115  Resp: 18  (!) 27 (!) 29  Temp: 98.7 F (37.1 C)  98.1 F (36.7 C)   TempSrc: Axillary  Oral   SpO2:   100% 100%  Weight:      Height:       General: alert, cooperative and no distress Lochia: appropriate Uterine Fundus: firm Incision: Dressing is clean, dry, and intact DVT Evaluation: No evidence of DVT seen on physical exam. Labs: Lab Results  Component Value Date   WBC 6.4 12/02/2020   HGB 11.0 (L) 12/02/2020   HCT 33.2 (L) 12/02/2020   MCV 83.4 12/02/2020   PLT 173 12/02/2020   No flowsheet data found. Edinburgh Score: Edinburgh Postnatal Depression Scale Screening Tool 10/04/2017  I have been able to laugh and see the funny side of things. 0  I have looked forward with enjoyment to things. 0  I have blamed myself unnecessarily when things went wrong. 0  I have been anxious or worried for no good reason. 1  I have felt scared or panicky for no good reason. 0  Things have been getting on top of me. 2  I have been so unhappy that I have had difficulty sleeping. 0  I have felt  sad or miserable. 0  I have been so unhappy that I have been crying. 0  The thought of harming myself has occurred to me. 0  Edinburgh Postnatal Depression Scale Total 3     After visit meds:  Allergies as of 12/04/2020   No Known Allergies     Medication List    STOP taking these medications   [place] these medications   acetaminophen 325 MG tablet Commonly known as: TYLENOL Take 2 tablets (650 mg total) by mouth every 4 (four) hours as needed for mild pain (temperature > 101.5.).   docusate sodium 100 MG capsule Commonly known as: COLACE Take 1 capsule (100 mg total) by mouth 2 (two) times daily as needed.   ferrous sulfate 325 (65 FE) MG tablet Take 1 tablet  (325 mg total) by mouth every other day.   ibuprofen 600 MG tablet Commonly known as: ADVIL Take 1 tablet (600 mg total) by mouth every 6 (six) hours as needed.   oxyCODONE 5 MG immediate release tablet Commonly known as: Oxy IR/ROXICODONE Take 1-2 tablets (5-10 mg total) by mouth every 4 (four) hours as needed for up to 5 days for moderate pain.   prenatal multivitamin Tabs tablet Take 1 tablet by mouth daily at 12 noon.      Discharge home in stable condition Infant Feeding: Breast Infant Disposition:home with mother Discharge instruction: per After Visit Summary and Postpartum booklet. Activity: Advance as tolerated. Pelvic rest for 6 weeks.  Diet: routine diet Future Appointments:No future appointments. Follow up Visit: Message sent to  [facility] by Dr. Astrid Drafts.  Please schedule this patient for a In person postpartum visit in 6 weeks with the following provider: Any provider. Additional Postpartum F/U:Postpartum Depression checkup and Incision check 1 week  High risk pregnancy complicated by: now h/o Cesarean x2, h/o postpartum depresion Delivery mode:  C-Section, Low Transverse  Anticipated Birth Control:  PP Depo given  Mallie Snooks, MSN, CNM Certified Nurse Midwife, [employer] for [employer], [place] 12/04/20 7:36 AM

## 2020-12-02 NOTE — Progress Notes (Signed)
Labor Progress Note Gina Montoya is a 30 y.o. F6O1308 at [redacted]w[redacted]d presented for eIOL in the setting of prior Cesarean.  S: Pt reports increasing discomfort with contractions  O:  BP 114/67   Pulse 93   Temp 98.8 F (37.1 C) (Axillary)   Resp 16   Ht 5\' 2"  (1.575 m)   Wt 69.1 kg   LMP 02/14/2020 (Exact Date)   SpO2 97%   BMI 27.87 kg/m  EFM: baseline 120/moderate variability/+accels/intermittent deep variable decels with contractions  CVE: Dilation: 6 Effacement (%): 70 Station: -2 Presentation: Vertex Exam by:: [email or web address] CNM   A&P: 30 y.o. 26 [redacted]w[redacted]d presented for eIOL in the setting of prior Cesarean. #TOLAC: Pt s/p FB (out at 1315). Will plan to start pitocin s/p epidural placement s/p shared decision making with patient. #Pain: IV fentanyl, will prep for epidural now #FWB: Category 2 strip given intermittent variable decels; reassuringly, moderate variability and +accels #GBS positive on penicillin #H/o Depression: plan for SW consult and 2 week mood check in clinic  [redacted]w[redacted]d, MD 2:30PM

## 2020-12-02 NOTE — Progress Notes (Signed)
Discussed placing fetal scalp lead due to difficulty tracing FHR during contractions. Pt declines at this time. U/S adjusted.

## 2020-12-02 NOTE — Progress Notes (Signed)
At bedside to discuss fetal monitoring- recurrent variable decels noted to 60bpm.  Pitocin turned off, repositioned and Terbutaline given.  SVE unchanged at 6cm per Donette Larry, CNM.  Due to fetal intolerance to labor and advised repeat C-section.  Risk benefits and alternatives of cesarean section were discussed with the patient including but not limited to infection, bleeding, damage to bowel , bladder and baby with the need for further surgery. Pt voiced understanding and desires to proceed.   Myna Hidalgo, DO Attending Obstetrician & Gynecologist, [facility] for [employer], [facility]

## 2020-12-02 NOTE — Anesthesia Procedure Notes (Signed)
Epidural Patient location during procedure: OB Start time: 12/02/2020 2:36 PM End time: 12/02/2020 2:41 PM  Staffing Anesthesiologist: Lowella Curb, MD Performed: anesthesiologist   Preanesthetic Checklist Completed: patient identified, IV checked, site marked, risks and benefits discussed, surgical consent, monitors and equipment checked, pre-op evaluation and timeout performed  Epidural Patient position: sitting Prep: ChloraPrep Patient monitoring: heart rate, cardiac monitor, continuous pulse ox and blood pressure Approach: midline Location: L2-L3 Injection technique: LOR saline  Needle:  Needle type: Tuohy  Needle gauge: 17 G Needle length: 9 cm Needle insertion depth: 6 cm Catheter type: closed end flexible Catheter size: 20 Guage Catheter at skin depth: 10 cm Test dose: negative  Assessment Events: blood not aspirated, injection not painful, no injection resistance, no paresthesia and negative IV test  Additional Notes Epidural placed by SRNA under direct supervisionReason for block:procedure for pain

## 2020-12-02 NOTE — Anesthesia Preprocedure Evaluation (Signed)
Anesthesia Evaluation  Patient identified by MRN, date of birth, ID band Patient awake    Reviewed: Allergy & Precautions, H&P , NPO status , Patient's Chart, lab work & pertinent test results, reviewed documented beta blocker date and time   Airway Mallampati: II  TM Distance: >3 FB Neck ROM: full    Dental no notable dental hx.    Pulmonary neg pulmonary ROS,    Pulmonary exam normal breath sounds clear to auscultation       Cardiovascular negative cardio ROS Normal cardiovascular exam Rhythm:regular Rate:Normal     Neuro/Psych Anxiety Depression negative neurological ROS  negative psych ROS   GI/Hepatic negative GI ROS, Neg liver ROS,   Endo/Other  negative endocrine ROS  Renal/GU negative Renal ROS  negative genitourinary   Musculoskeletal   Abdominal   Peds  Hematology negative hematology ROS (+)   Anesthesia Other Findings   Reproductive/Obstetrics (+) Pregnancy                             Anesthesia Physical  Anesthesia Plan  ASA: II  Anesthesia Plan: Epidural   Post-op Pain Management:    Induction:   PONV Risk Score and Plan:   Airway Management Planned:   Additional Equipment:   Intra-op Plan:   Post-operative Plan:   Informed Consent: I have reviewed the patients History and Physical, chart, labs and discussed the procedure including the risks, benefits and alternatives for the proposed anesthesia with the patient or authorized representative who has indicated his/her understanding and acceptance.       Plan Discussed with:   Anesthesia Plan Comments:         Anesthesia Quick Evaluation

## 2020-12-02 NOTE — Progress Notes (Signed)
Labor Progress Note Datra Clary is a 30 y.o. [id number] at [redacted]w[redacted]d presented for eIOL, TOLAC  S:  Comfortable with epidural.  O:  BP 106/72   Pulse 81   Temp 98.7 F (37.1 C) (Axillary)   Resp 18   Ht 5\' 2"  (1.575 m)   Wt 69.1 kg   LMP 02/14/2020 (Exact Date)   SpO2 97%   BMI 27.87 kg/m  EFM: baseline 135 bpm/ mod variability/ no accels/ variable decels  Toco/IUPC: 1-3 SVE: 6/60/-1 Pitocin: off  A/P: 30 y.o. 26 [redacted]w[redacted]d  1. Labor: latent 2. FWB: Cat II 3. Pain: epidural  FHR with recurrent variables, IUPC placed and amnioinfusion ordered. Very quickly varibles became more severe,Terbutaline ordered. Dr. [redacted]w[redacted]d in room to discuss MOD. Plan for CS.  Charlotta Newton, CNM 5:56 PM

## 2020-12-02 NOTE — Discharge Instructions (Signed)

## 2020-12-02 NOTE — H&P (Addendum)
OBSTETRIC ADMISSION HISTORY AND PHYSICAL  Gina Montoya is a 30 y.o. female [phone] with IUP at [redacted]w[redacted]d presenting for eIOL, TOLAC. She reports +FMs. No LOF, VB, blurry vision, headaches, peripheral edema, or RUQ pain. She plans on breastfeeding. She requests Depo for birth control.  Dating: By Mackie Pai [place] --->  Estimated Date of Delivery: 12/02/20  Sono:    @[redacted]w[redacted]d , normal anatomy, ceph presentation, 2629g, 30%ile, EFW 5'13   Prenatal History/Complications: - previous CS for breech - Covid+ 09/14/20 - hx PPD - hx of IUGR in previous pregnancy - late prenatal care  Past Medical History: Past Medical History:  Diagnosis Date   Anxiety    Depression    Dyspnea     Past Surgical History: Past Surgical History:  Procedure Laterality Date   CESAREAN SECTION N/A 08/28/2017   Procedure: CESAREAN SECTION;  Surgeon: 08/30/2017, DO;  Location: [facility];  Service: Obstetrics;  Laterality: N/A;    Obstetrical History: OB History     Gravida  4   Para  2   Term  2   Preterm      AB  1   Living  2      SAB      IAB      Ectopic      Multiple  0   Live Births  2           Social History: Social History   Socioeconomic History   Marital status: Single    Spouse name: Not on file   Number of children: 2   Years of education: Not on file   Highest education level: Not on file  Occupational History   Occupation: [employer]  Tobacco Use   Smoking status: Never Smoker   Smokeless tobacco: Never Used  Levie Heritage Use: Never used  Substance and Sexual Activity   Alcohol use: No   Drug use: No   Sexual activity: Yes    Birth control/protection: None  Other Topics Concern   Not on file  Social History Narrative   Not on file   Social Determinants of Health   Financial Resource Strain: Not on file  Food Insecurity: Not on file  Transportation Needs: Not on file  Physical Activity: Not on file  Stress: Not on file  Social  Connections: Not on file    Family History: Family History  Problem Relation Age of Onset   Depression Mother     Allergies: No Known Allergies  Medications Prior to Admission  Medication Sig Dispense Refill Last Dose   Elastic Bandages & Supports (COMFORT FIT MATERNITY SUPP SM) MISC Wear as directed. 1 each 0    ferrous sulfate 325 (65 FE) MG tablet Take 1 tablet (325 mg total) by mouth every other day. 30 tablet 5     Review of Systems:  All systems reviewed and negative except as stated in HPI  PE: Blood pressure 114/69, pulse 96, temperature 98.8 F (37.1 C), temperature source Axillary, resp. rate 16, height 5\' 2"  (1.575 m), weight 69.1 kg, last menstrual period 02/14/2020, unknown if currently breastfeeding. General appearance: alert, cooperative and no distress Lungs: regular rate and effort Heart: regular rate  Abdomen: soft, non-tender Extremities: Homans sign is negative, no sign of DVT Presentation: cephalic by BSUS EFM: 140 bpm, mod variability, + accels, no decels Toco: irreg Exam by:: M BHambri CNM SVE: 1/50/-3  Prenatal labs: ABO, Rh: --/--/AB POS (03/24 07-22-1970) Antibody: NEG (03/24 2376) Rubella:  1.29 (10/05 1113) RPR: Non Reactive (12/21 0954)  HBsAg: Negative (10/05 1113)  HIV: Non Reactive (12/21 0954)  GBS: Positive/-- (03/01 0447)  2 hr GTT nml  Prenatal Transfer Tool  Maternal Diabetes: No Genetic Screening: Declined Maternal Ultrasounds/Referrals: Isolated EIF (echogenic intracardiac focus) Fetal Ultrasounds or other Referrals:  None Maternal Substance Abuse:  No Significant Maternal Medications:  None Significant Maternal Lab Results: Group B Strep positive  Results for orders placed or performed during the hospital encounter of 12/02/20 (from the past 24 hour(s))  CBC   Collection Time: 12/02/20  8:39 AM  Result Value Ref Range   WBC 6.4 4.0 - 10.5 K/uL   RBC 3.98 3.87 - 5.11 MIL/uL   Hemoglobin 11.0 (L) 12.0 - 15.0 g/dL   HCT 89.2  (L) 11.9 - 46.0 %   MCV 83.4 80.0 - 100.0 fL   MCH 27.6 26.0 - 34.0 pg   MCHC 33.1 30.0 - 36.0 g/dL   RDW 41.7 40.8 - 14.4 %   Platelets 173 150 - 400 K/uL   nRBC 0.0 0.0 - 0.2 %  Type and screen   Collection Time: 12/02/20  8:39 AM  Result Value Ref Range   ABO/RH(D) AB POS    Antibody Screen NEG    Sample Expiration      12/05/2020,2359 Performed at [facility], [address], [place], [place] [address]     Patient Active Problem List   Diagnosis Date Noted   Encounter for elective induction of labor 12/02/2020   History of cesarean section 10/12/2020   COVID-19 affecting pregnancy, antepartum 10/12/2020   History of intrauterine growth restriction in prior pregnancy, currently pregnant 06/15/2020   Supervision of other normal pregnancy, antepartum 06/07/2020   Post partum depression 04/18/2017    Assessment: Gina Montoya is a 30 y.o. [id number] at [redacted]w[redacted]d here for elective IOL, TOLAC  1. Labor: latent 2. FWB: Cat I 3. Pain: analgesia/anesthesia prn 4. GBS: pos   Plan: Admit to LD Lengthy discussion with pt and partner regarding elective IOL after previous CS. IOL involves using medications that may not be tolerated by the baby and may increase the risk of uterine rupture. If either of these occur it may necessitate the need for an emergency CS and could lead to poor materal and/or neonatal outcomes. She was informed that the best chance for a successful VBAC would be from spontaneous labor but if this does not occur by 41 wks then IOL would be recommended at that time. The patient was given the option to discharge home and await spontaneous labor. After discussion with her partner she elects to stay and proceed with elective IOL and accepts the risks.  Plan for FB and low dose Pitocin TOLAC consent on chart   Donette Larry, CNM  12/02/2020, 10:18 AM  Attestation of Attending Supervision of Advanced Practice Provider (PA/CNM/NP): Evaluation and management  procedures were performed by the Advanced Practice Provider under my supervision and collaboration.  I have reviewed the Advanced Practice Provider's note and chart, and I agree with the management and plan. I have also made any necessary editorial changes.   Sharon Seller, DO Attending Obstetrician & Gynecologist, [facility] for [facility], [facility] 12/02/2020 1:55 PM

## 2020-12-02 NOTE — Transfer of Care (Signed)
Immediate Anesthesia Transfer of Care Note  Patient: Gina Montoya  Procedure(s) Performed: CESAREAN SECTION (N/A )  Patient Location: PACU  Anesthesia Type:Epidural  Level of Consciousness: awake, alert  and oriented  Airway & Oxygen Therapy: Patient Spontanous Breathing  Post-op Assessment: Report given to RN and Post -op Vital signs reviewed and stable  Post vital signs: Reviewed and stable  Last Vitals:  Vitals Value Taken Time  BP 110/49 12/02/20 1917  Temp    Pulse 116 12/02/20 1922  Resp 20 12/02/20 1922  SpO2 97 % 12/02/20 1922  Vitals shown include unvalidated device data.  Last Pain:  Vitals:   12/02/20 1703  TempSrc: Axillary  PainSc: 0-No pain      Patients Stated Pain Goal: 0 (12/02/20 1146)  Complications: No complications documented.

## 2020-12-02 NOTE — Op Note (Addendum)
Gina Montoya PROCEDURE DATE: 12/02/2020  PREOPERATIVE DIAGNOSES: Intrauterine pregnancy at [redacted]w[redacted]d weeks gestation;  non-reassuring fetal heart tones  POSTOPERATIVE DIAGNOSES: The same, Direct OP Fetal Presentation  PROCEDURE: Repeat Low Transverse Cesarean Section  SURGEON:  Dr. Myna Hidalgo  ASSISTANT:  Dr. Lynnda Shields  ANESTHESIOLOGY TEAM: Anesthesiologist: Shelton Silvas, MD; Lowella Curb, MD CRNA: Armanda Heritage, CRNA  INDICATIONS: Gina Montoya is a 30 y.o. [phone] at [redacted]w[redacted]d here for cesarean section secondary to the indications listed under preoperative diagnoses; please see preoperative note for further details.  The risks of surgery were discussed with the patient including but were not limited to: bleeding which may require transfusion or reoperation; infection which may require antibiotics; injury to bowel, bladder, ureters or other surrounding organs; injury to the fetus; need for additional procedures including hysterectomy in the event of a life-threatening hemorrhage; formation of adhesions; placental abnormalities wth subsequent pregnancies; incisional problems; thromboembolic phenomenon and other postoperative/anesthesia complications.  The patient concurred with the proposed plan, giving informed written consent for the procedure.    FINDINGS:  Viable female infant in cephalic presentation.  Apgars 9 and 9.  Clear amniotic fluid.  Intact placenta, three vessel cord.  Normal uterus, fallopian tubes and ovaries bilaterally. Tight nuchal cord x2. Direct OP fetal presentation.  ANESTHESIA: Epidural  INTRAVENOUS FLUIDS: 2500 ml   ESTIMATED BLOOD LOSS: 550 ml URINE OUTPUT:  100 ml SPECIMENS: Placenta sent to pathology COMPLICATIONS: None immediate  PROCEDURE IN DETAIL:  The patient preoperatively received intravenous antibiotics (azithromycin, ancef) and had sequential compression devices applied to her lower extremities.  She was then taken to the  operating room where the epidural anesthesia was dosed up to surgical level and was found to be adequate. She was then placed in a dorsal supine position with a leftward tilt, and prepped and draped in a sterile manner.  A foley catheter was placed into her bladder and attached to constant gravity.  After an adequate timeout was performed, a Pfannenstiel skin incision was made with scalpel on her preexisting scar and carried through to the underlying layer of fascia. The fascia was incised in the midline, and this incision was extended bilaterally using the Mayo scissors. Kocher clamps were applied to the superior aspect of the fascial incision and the underlying rectus muscles were dissected off bluntly and sharply.  A similar process was carried out on the inferior aspect of the fascial incision. The rectus muscles were separated in the midline and the peritoneum was entered bluntly. The Alexis self-retaining retractor was introduced into the abdominal cavity.  Attention was turned to the lower uterine segment where a low transverse hysterotomy was made with a scalpel and extended bilaterally bluntly.  The infant was successfully delivered (found to be in direct OP presentation with tight nuchal cord x2 reduced prior to delivery of shoulders and body), the cord was clamped and cut after one minute, and the infant was handed over to the awaiting neonatology team. Uterine massage was then administered, and the placenta delivered intact with a three-vessel cord. The uterus was then cleared of clots and debris. Given poor uterine tone, a dose of methergine was administered in addition to TXA. The hysterotomy was closed in 3 layers; a 0 Vicryl in a running locked fashion, followed by 2 imbricating layers with 0 Vicryl given uterine thickness.  Several figure-of-eight and mattress 0 Vicryl serosal stitches were placed to help with hemostasis. Surgicel was placed along the hysterotomy site to assist with hemostasis. The  pelvis was cleared of all clot and debris. Hemostasis was confirmed on all surfaces.  The retractor was removed.  The peritoneum was closed with a 2-0 Vicryl running stitch. The fascia was then closed using 0 Vicryl in a running fashion.  The subcutaneous layer was irrigated, and the skin was closed with a 4-0 Vicryl subcuticular stitch. The patient tolerated the procedure well. Sponge, instrument and needle counts were correct x 3.  She was taken to the recovery room in stable condition.   Sheila Oats, MD OB Fellow, [facility] 12/02/2020 7:20 PM  Attestation of Attending Supervision of Obstetric Fellow: Evaluation and management procedures were performed by the Obstetric Fellow under my supervision and collaboration.  I have reviewed the Obstetric Fellow's note and chart, and I agree with the management and plan. I have also made any necessary editorial changes.   Sharon Seller, DO Attending Obstetrician & Gynecologist, [facility] for [facility], [facility] 12/03/2020 6:46 AM

## 2020-12-03 ENCOUNTER — Encounter (HOSPITAL_COMMUNITY): Payer: Self-pay | Admitting: Obstetrics & Gynecology

## 2020-12-03 LAB — CBC
HCT: 33.9 % — ABNORMAL LOW (ref 36.0–46.0)
Hemoglobin: 11.1 g/dL — ABNORMAL LOW (ref 12.0–15.0)
MCH: 27.9 pg (ref 26.0–34.0)
MCHC: 32.7 g/dL (ref 30.0–36.0)
MCV: 85.2 fL (ref 80.0–100.0)
Platelets: 170 10*3/uL (ref 150–400)
RBC: 3.98 MIL/uL (ref 3.87–5.11)
RDW: 14.7 % (ref 11.5–15.5)
WBC: 15 10*3/uL — ABNORMAL HIGH (ref 4.0–10.5)
nRBC: 0 % (ref 0.0–0.2)

## 2020-12-03 NOTE — Plan of Care (Signed)
  Problem: Education: Goal: Knowledge of General Education information will improve Description: Including pain rating scale, medication(s)/side effects and non-pharmacologic comfort measures Outcome: Completed/Met   Problem: Clinical Measurements: Goal: Ability to maintain clinical measurements within normal limits will improve Outcome: Completed/Met Goal: Diagnostic test results will improve Outcome: Completed/Met Goal: Respiratory complications will improve Outcome: Completed/Met Goal: Cardiovascular complication will be avoided Outcome: Completed/Met   Problem: Activity: Goal: Risk for activity intolerance will decrease Outcome: Completed/Met   Problem: Nutrition: Goal: Adequate nutrition will be maintained Outcome: Completed/Met   Problem: Elimination: Goal: Will not experience complications related to bowel motility Outcome: Completed/Met Goal: Will not experience complications related to urinary retention Outcome: Completed/Met   Problem: Pain Managment: Goal: General experience of comfort will improve Outcome: Completed/Met   Problem: Safety: Goal: Ability to remain free from injury will improve Outcome: Completed/Met   Problem: Education: Goal: Knowledge of condition will improve Outcome: Completed/Met   Problem: Activity: Goal: Will verbalize the importance of balancing activity with adequate rest periods Outcome: Completed/Met Goal: Ability to tolerate increased activity will improve Outcome: Completed/Met   Problem: Life Cycle: Goal: Chance of risk for complications during the postpartum period will decrease Outcome: Completed/Met   Problem: Role Relationship: Goal: Ability to demonstrate positive interaction with newborn will improve Outcome: Completed/Met

## 2020-12-03 NOTE — Lactation Note (Signed)
This note was copied from a baby's chart. Lactation Consultation Note Baby 11 hrs old. Mom states that baby has been BF well. Baby has also been spitting up even though baby has been sucking on his hand she wasn't going to feed him right now because she feels that he's spitting up because he's over fed. Explained to mom that the baby is spitting up d/t amniotic fluid and it's ok to feed the baby.  Mom has 29 yr old that she BF for 13 months and a 29 yr old that she BF for 3 months and stopped d/t severe PPD.  Assisted mom in latching. Reminded mom of chin tug. Discussed newborn feeding habits, behavior, STS, I&O, positioning, support, supply and demand. Mom encouraged to feed baby 8-12 times/24 hours and with feeding cues.   Suggested mom using DEBP d/t baby 6.3 LBs BW. Mom stated she will wait. Encouraged mom to call for assistance or questions. Lactation brochure given.  Patient Name: Gina Montoya [id number] Date: 12/03/2020 Reason for consult: Initial assessment;Term Age:12 hours  Maternal Data Has patient been taught Hand Expression?: Yes Does the patient have breastfeeding experience prior to this delivery?: Yes How long did the patient breastfeed?: 13 months 1st, 3 months 2nd child.  Feeding    LATCH Score Latch: Grasps breast easily, tongue down, lips flanged, rhythmical sucking.  Audible Swallowing: A few with stimulation  Type of Nipple: Everted at rest and after stimulation  Comfort (Breast/Nipple): Soft / non-tender  Hold (Positioning): Assistance needed to correctly position infant at breast and maintain latch.  LATCH Score: 8   Lactation Tools Discussed/Used Tools:  (mom denied pump at this time)  Interventions Interventions: Breast feeding basics reviewed;Support pillows;Assisted with latch;Position options;Skin to skin;Breast massage;Hand express;Breast compression;Adjust position  Discharge [facility] Program: Yes  Consult Status Consult Status:  Follow-up Date: 12/04/20 Follow-up type: In-patient    Charyl Dancer 12/03/2020, 6:07 AM

## 2020-12-03 NOTE — Social Work (Signed)
CSW received consult for hx of Anxiety and postpartum depression.  CSW met with MOB to offer support and complete assessment.    CSW met with MOB at bedside. CSW introduced role. Nurse and FOB at bedside. CSW asked if it was okay to come in or return at different time. MOB receptive for CSW visit. Nurse present at the time to assess the infant. MOB agreeable for FOB to stay during the assessment. CSW asked MOB she feels. MOB reports, " I am ok, I have some anxiety. It was a traumatic experience to have an emergency c-section." MOB reports she has been thinking about her life with a newborn with two small toddler 3 and 4. MOB reports during her last pregnancy she had postpartum depression. MOB reports she enrolled into counseling services at [facility] for about 4-5 months. MOB reports counseling was helpful. MOB reports she has not been prescribed any medications for mental health. CSW provided active listening and validated MOB feelings.   CSW provided education regarding the baby blues period vs. perinatal mood disorders, discussed treatment and gave resources for mental health follow up if concerns arise. CSW encouraged MOB to reach out to [facility] again since she liked the services. MOB agreeable.CSW recommended MOB complete a self-evaluation during the postpartum time period using the New Mom Checklist from Postpartum Progress and encouraged MOB to contact a medical professional if symptoms are noted at any time. MOB receptive to the handout and agreeable to use the checklist. CSW asked MOB about her supports. MOB reports the FOB and her aunt are her supports.   CSW provided review of Sudden Infant Death Syndrome (SIDS) precautions and informed no-co sleeping with infant. CSW asked if MOB had items for the infant. MOB reports she has items for the infant including a car seat and bassinet. MOB reports she is receiving WIC and food stamp services. MOB has chosen a  Lexicographer at [place]. CSW assessed for additional needs.    CSW identifies no further need for intervention and no barriers to discharge at this time.  Kathrin Greathouse, MSW, LCSW [facility] Worker  [phone] 12/03/2020  4:20 PM

## 2020-12-03 NOTE — Lactation Note (Addendum)
This note was copied from a baby's chart. Lactation Consultation Note  Patient Name: Gina Montoya [id number] Date: 12/03/2020 Reason for consult: Follow-up assessment;Term, C/S delivery Age:29 hours P3, term female infant with 2 stools since birth. Infant not had any recent episodes of being spitty  Per mom, breastfeeding is going well and infant breastfeeds 10 to 15 minutes most feedings. Mom is  experienced  at breastfeeding, she breastfeed her 1st child 13 months.  LC did not observe latch per mom, infant breastfeed for 15 minutes less than 1 hr 30 minutes ago. LC reviewed how to hand express and mom taught back infant was given 4 mls of EBM. Mom knows she can hand express after latching infant at the breast and give infant extra volume of her EBM. LC reviewed breastfeed infant according to cues, kissing, licking, smacking, hands in mouth, rooting, 8 to 12+ times or more within 24 hours, STS. Mom knows to call RN or LC if she has any questions, concerns or need assistance with latching infant at the breast.  Mom's nipples are intact and evert, no abrasions.  Maternal Data Has patient been taught Hand Expression?: Yes  Feeding Mother's Current Feeding Choice: Breast Milk  LATCH Score              Lactation Tools Discussed/Used    Interventions Interventions: Breast feeding basics reviewed;Skin to skin;Hand express;Expressed milk;Position options  Discharge Pump: Personal WIC Program: Yes  Consult Status Consult Status: Follow-up Date: 12/05/20 Follow-up type: In-patient    Danelle Earthly 12/03/2020, 3:51 PM

## 2020-12-03 NOTE — Progress Notes (Addendum)
POSTPARTUM PROGRESS NOTE  Subjective: Gina Montoya is a 30 y.o. [id number] on POD#1 s/p rLTCS at [redacted]w[redacted]d.  She reports she is doing well. No acute events overnight. She denies any problems with po intake, although she feels lightheaded and dizzy when ambulating. She has not eaten since yesterday. Denies nausea or vomiting. She has not passed flatus. Foley catheter in place. She reports itching near the incision site and is receiving 25 mg benadryl q6. Pain is well controlled.  Lochia is appropriate.  Objective: Blood pressure 117/72, pulse 83, temperature 98.6 F (37 C), temperature source Oral, resp. rate 16, height 5\' 2"  (1.575 m), weight 69.1 kg, last menstrual period 02/14/2020, SpO2 99 %, unknown if currently breastfeeding.  Physical Exam:  General: alert, cooperative and no distress Chest: no respiratory distress Abdomen: soft, non-tender  Incision: Dressing is clean, dry, and intact. Uterine Fundus: firm and at level of umbilicus Extremities: no calf swelling or tenderness  no edema  Recent Labs    12/02/20 0839  HGB 11.0*  HCT 33.2*    Assessment/Plan: Gina Montoya is a 30 y.o. 26 on POD#1 s/p rLTCS at [redacted]w[redacted]d for post-op care.  Routine Postpartum Care: Doing well, pain well-controlled.  -- Continue routine care, lactation support  -- Contraception: Depo -- Feeding: Breast -- Circumcision: consented -- Vaccines: will receive Tdap   -- History of postpartum depression: Mood and affect stable. Counseled about feelings of sadness and depression in postpartum period. SW consulted.   Dispo: Plan for discharge on POD#2-3.  [redacted]w[redacted]d, Medical Student 12/03/2020 7:40 AM  GME ATTESTATION:  I saw and evaluated the patient. I agree with the findings and the plan of care as documented in the medical student's note.  12/05/2020, MD OB Fellow, Faculty [facility], Center for [facility] 12/03/2020 7:43 AM

## 2020-12-04 MED ORDER — DOCUSATE SODIUM 100 MG PO CAPS: 100.0000 mg | ORAL_CAPSULE | Freq: Two times a day (BID) | ORAL | 2 refills | Status: DC | PRN

## 2020-12-04 MED ORDER — ACETAMINOPHEN 325 MG PO TABS: 650.0000 mg | ORAL_TABLET | ORAL | 0 refills | Status: AC | PRN

## 2020-12-04 MED ORDER — OXYCODONE HCL 5 MG PO TABS: 5.0000 mg | ORAL_TABLET | ORAL | 0 refills | Status: AC | PRN

## 2020-12-04 MED ORDER — MEDROXYPROGESTERONE ACETATE 150 MG/ML IM SUSP
150.0000 mg | Freq: Once | INTRAMUSCULAR | Status: AC
  Administered 2020-12-04: 150 mg via INTRAMUSCULAR
  Filled 2020-12-04: qty 1

## 2020-12-04 MED ORDER — IBUPROFEN 600 MG PO TABS: 600.0000 mg | ORAL_TABLET | Freq: Four times a day (QID) | ORAL | 3 refills | Status: DC | PRN

## 2020-12-04 NOTE — Lactation Note (Signed)
This note was copied from a baby's chart. Lactation Consultation Note  Patient Name: Gina Montoya [id number] Date: 12/04/2020 Reason for consult: Follow-up assessment;Term;Infant weight loss;Other (Comment) (4 % weight loss) Age:29 hours/ P 3 experienced LC reviewed and updated the doc flow sheets per mom.  Baby woke up during [facility] visit and mom latched the baby on the left breast / football with depth and swallows, baby fed 15 mins, nipple well rounded when baby released. Latch score 8.  See below for teaching ( breast feeding )  Mom has the [facility] brochure with resource numbers.   Maternal Data    Feeding Mother's Current Feeding Choice: Breast Milk  LATCH Score Latch: Grasps breast easily, tongue down, lips flanged, rhythmical sucking.  Audible Swallowing: A few with stimulation  Type of Nipple: Everted at rest and after stimulation  Comfort (Breast/Nipple): Filling, red/small blisters or bruises, mild/mod discomfort  Hold (Positioning): No assistance needed to correctly position infant at breast.  LATCH Score: 8   Lactation Tools Discussed/Used    Interventions Interventions: Breast feeding basics reviewed;Assisted with latch;Skin to skin;Breast massage;Adjust position;Support pillows;Breast compression  Discharge Discharge Education: Engorgement and breast care;Warning signs for feeding baby Pump: Personal;DEBP  Consult Status Consult Status: Complete Date: 12/04/20    Kathrin Greathouse 12/04/2020, 9:28 AM

## 2020-12-06 ENCOUNTER — Telehealth: Payer: Self-pay | Admitting: *Deleted

## 2020-12-06 LAB — SURGICAL PATHOLOGY

## 2020-12-06 NOTE — Telephone Encounter (Signed)
Transition Care Management Unsuccessful Follow-up Telephone Call  Date of discharge and from where:  12/04/2020 - [facility]  Attempts:  1st Attempt  Reason for unsuccessful TCM follow-up call:  Left voice message

## 2020-12-07 NOTE — Telephone Encounter (Signed)
Transition Care Management Unsuccessful Follow-up Telephone Call  Date of discharge and from where:  12/04/2020 - [facility]  Attempts:  2nd Attempt  Reason for unsuccessful TCM follow-up call:  Left voice message

## 2020-12-08 NOTE — Telephone Encounter (Signed)
Transition Care Management Follow-up Telephone Call  Date of discharge and from where: 12/04/2020 from  [facility]   How have you been since you were released from the hospital? Pt stated that things going pretty well. Pt was concerned about the lacting and has reached out OBGYN for a lactation consultant to go to patients home. Pt has not heard anything. Pt encouraged to call back and if they were not then perhaps they could see her while she was in the office on Friday.   Any questions or concerns? No  Items Reviewed:  Did the pt receive and understand the discharge instructions provided? Yes   Medications obtained and verified? Yes   Other? No   Any new allergies since your discharge? No   Dietary orders reviewed? n/a  Do you have support at home? Yes   Functional Questionnaire: (I = Independent and D = Dependent) ADLs: I  Bathing/Dressing- I  Meal Prep- I  Eating- I  Maintaining continence- I  Transferring/Ambulation- I  Managing Meds- I   Follow up appointments reviewed:   PCP Hospital f/u appt confirmed? No    Specialist Hospital f/u appt confirmed? Yes  Scheduled to see [facility] Nurse on 12/10/2020 @ 11:00am.  Are transportation arrangements needed? No    If their condition worsens, is the pt aware to call PCP or go to the Emergency Dept.? Yes  Was the patient provided with contact information for the PCP's office or ED? Yes  Was to pt encouraged to call back with questions or concerns? Yes

## 2020-12-10 ENCOUNTER — Ambulatory Visit (INDEPENDENT_AMBULATORY_CARE_PROVIDER_SITE_OTHER): Payer: Medicaid Other

## 2020-12-10 ENCOUNTER — Other Ambulatory Visit: Payer: Self-pay

## 2020-12-10 VITALS — BP 120/76 | HR 80 | Wt 144.0 lb

## 2020-12-10 DIAGNOSIS — Z4889 Encounter for other specified surgical aftercare: Secondary | ICD-10-CM

## 2020-12-10 NOTE — Progress Notes (Signed)
Patient was assessed and managed by nursing staff during this encounter. I have reviewed the chart and agree with the documentation and plan. I have also made any necessary editorial changes.  Maelys Kinnick, MD 12/10/2020 12:17 PM 

## 2020-12-10 NOTE — Progress Notes (Signed)
Subjective:  Gina Montoya is a 30 y.o. female here for BP check and incision check s/p TV/CS on 12/02/20.  Hypertension ROS: no TIA's, no chest pain on exertion, no dyspnea on exertion and no swelling of ankles.    Objective:  BP 120/76   Pulse 80   Wt 144 lb (65.3 kg)   LMP 02/14/2020 (Exact Date)   BMI 26.34 kg/m   Appearance alert, well appearing, and in no distress. General exam BP noted to be well controlled today in office.     Assessment:   Blood Pressure well controlled.  Wound CDI Rash left pelvic area  Plan:  Keep upcoming appt  Pt to apply Cortisone cream on rash per MD  Dalphine Handing, CMA

## 2020-12-20 ENCOUNTER — Ambulatory Visit (INDEPENDENT_AMBULATORY_CARE_PROVIDER_SITE_OTHER): Payer: Medicaid Other | Admitting: Licensed Clinical Social Worker

## 2020-12-20 DIAGNOSIS — F53 Postpartum depression: Secondary | ICD-10-CM | POA: Diagnosis not present

## 2020-12-20 DIAGNOSIS — O99345 Other mental disorders complicating the puerperium: Secondary | ICD-10-CM

## 2020-12-22 NOTE — BH Specialist Note (Signed)
Integrated Behavioral Health via Telemedicine Visit  12/22/2020 [name] [id number]  Number of Integrated Behavioral Health visits: 1/6 Session Start time: 2:00pm   Session End time: 2:24pm Total time: 24 mins via mychart    Referring Provider: [place]  Patient/Family location: Home  [facility] Provider location: [facility]  All persons participating in visit: Pt Gina Montoya and LCSWA A. Delcia Spitzley Types of Service: Individual psychotherapy and Video visit  I connected with Gina Montoya and/or Gina Montoya's n/a via  Telephone or Video Enabled Telemedicine Application  (Video is Caregility application) and verified that I am speaking with the correct person using two identifiers. Discussed confidentiality: Yes   I discussed the limitations of telemedicine and the availability of in person appointments.  Discussed there is a possibility of technology failure and discussed alternative modes of communication if that failure occurs.  I discussed that engaging in this telemedicine visit, they consent to the provision of behavioral healthcare and the services will be billed under their insurance.  Patient and/or legal guardian expressed understanding and consented to Telemedicine visit: Yes   Presenting Concerns: Patient and/or family reports the following symptoms/concerns: pp mood check  Duration of problem: three weeks; Severity of problem: mild  Patient and/or Family's Strengths/Protective Factors: Concrete supports in place (healthy food, safe environments, etc.)  Goals Addressed: Patient will: 1.  Reduce symptoms of: anxiety  2.  Increase knowledge and/or ability of: stress reduction  3.  Demonstrate ability to: Increase healthy adjustment to current life circumstances  Progress towards Goals: Ongoing  Interventions: Interventions utilized:  Supportive Counseling Standardized Assessments completed: edinburgh score 4   Assessment: Patient currently  experiencing adjustment disorder with anxiety  Patient may benefit from integrated behavioral health   Plan: 1. Follow up with behavioral health clinician on : as needed  2. Behavioral recommendations: delegate task to prevent burnout, prioritize rest, communicate needs to spouse and engage in self care and stress reducing activity such as walking, and yoga to boost mood and decrease social isolation   3. Referral(s): Integrated [employer] (In Clinic)  I discussed the assessment and treatment plan with the patient and/or parent/guardian. They were provided an opportunity to ask questions and all were answered. They agreed with the plan and demonstrated an understanding of the instructions.   They were advised to call back or seek an in-person evaluation if the symptoms worsen or if the condition fails to improve as anticipated.  Gina Saxon, LCSW

## 2020-12-29 ENCOUNTER — Encounter: Payer: Self-pay | Admitting: Nurse Practitioner

## 2020-12-29 ENCOUNTER — Ambulatory Visit (INDEPENDENT_AMBULATORY_CARE_PROVIDER_SITE_OTHER): Payer: Medicaid Other | Admitting: Nurse Practitioner

## 2020-12-29 ENCOUNTER — Other Ambulatory Visit: Payer: Self-pay

## 2020-12-29 VITALS — BP 116/78 | HR 94 | Wt 144.6 lb

## 2020-12-29 DIAGNOSIS — L7682 Other postprocedural complications of skin and subcutaneous tissue: Secondary | ICD-10-CM

## 2020-12-29 NOTE — Progress Notes (Signed)
Pt is in the office for incision check, c-section was on 12-02-20 Pt reports constant 7/10 pain with a small opening and pink tinged drainage from incision.

## 2020-12-29 NOTE — Progress Notes (Signed)
Gina Montoya 29 y.o. S/P Cesarean Section on 12-02-20  CC: bleeding from incision, pain at incision  S:  Has a tissue at the left side of the incision due to leaking. Taking 2 -  600 mg Ibuprofen tablets every 6 hours since the surgery and is still in pain.  O:  Tissue is dry.  Upon close inspection there is a pin point area of blood that forms in the left side of the incision - likely a suture dissolved.  No area of cellulitis.  Tenderness consistent with C/S that is healing.  Incision well healed except for the tiny pin point area.  A:  Stable healing incision, no infection Over medicating with ibuprofen and it is not helping  P: Change to ONE ibuproprofen 600 mg every 6 hours.  Can add tylenol by the package directions.  Has only been walking in the house and not frequently.  Has not been fixing food.  Advised to walk out side for 6 minutes (3 minutes away from the house and 3 minutes back) and to increase 1 minute a day to decrease the pain she is feeling from surgery. Advised that it is normal for the baby to awaken every 2-3 hour at night at this time.  Advised making sure the baby is awake more during the day than at night and that she takes time to sleep when the baby sleeps. Drink at least 8 8-oz glasses of water every day. Keep your appointment for your postpartum visit as scheduled in May. Client reassured by today's visit and was able to walk out holding onto her partner's arm.   Nolene Bernheim, RN, MSN, NP-BC Nurse Practitioner, [facility] for [employer], [facility] 12/29/2020 3:32 PM

## 2021-01-14 ENCOUNTER — Ambulatory Visit: Payer: Medicaid Other | Admitting: Obstetrics and Gynecology

## 2021-01-24 ENCOUNTER — Other Ambulatory Visit: Payer: Self-pay

## 2021-01-24 ENCOUNTER — Ambulatory Visit (INDEPENDENT_AMBULATORY_CARE_PROVIDER_SITE_OTHER): Payer: Medicaid Other | Admitting: Obstetrics & Gynecology

## 2021-01-24 ENCOUNTER — Encounter: Payer: Self-pay | Admitting: Obstetrics & Gynecology

## 2021-01-24 ENCOUNTER — Encounter: Payer: Self-pay | Admitting: Obstetrics

## 2021-01-24 DIAGNOSIS — Z3042 Encounter for surveillance of injectable contraceptive: Secondary | ICD-10-CM

## 2021-01-24 MED ORDER — MEDROXYPROGESTERONE ACETATE 150 MG/ML IM SUSP: 150.0000 mg | INTRAMUSCULAR | 3 refills | Status: DC

## 2021-01-24 NOTE — Progress Notes (Signed)
Post Partum Visit Note  Gina Montoya is a 30 y.o. [phone] female who presents for a postpartum visit. She is 7 weeks postpartum following a repeat cesarean section.  I have fully reviewed the prenatal and intrapartum course. The delivery was at 40 gestational weeks.  Anesthesia: spinal. Postpartum course has been unremarkable. Baby is doing well. Baby is feeding by breast. Bleeding spotting. Bowel function is normal. Bladder function is normal. Patient is sexually active. Contraception method is Depo-Provera injections, received 12/04/2020. Postpartum depression screening: negative. EPDS 0.   The pregnancy intention screening data noted above was reviewed. Potential methods of contraception were discussed. The patient elected to proceed with Hormonal Injection.    Edinburgh Postnatal Depression Scale - 01/24/21 0903      Edinburgh Postnatal Depression Scale:  In the Past 7 Days   I have been able to laugh and see the funny side of things. 0    I have looked forward with enjoyment to things. 0    I have blamed myself unnecessarily when things went wrong. 0    I have been anxious or worried for no good reason. 0    I have felt scared or panicky for no good reason. 0    Things have been getting on top of me. 1    I have been so unhappy that I have had difficulty sleeping. 0    I have felt sad or miserable. 0    I have been so unhappy that I have been crying. 0    The thought of harming myself has occurred to me. 0    Edinburgh Postnatal Depression Scale Total 1           The following portions of the patient's history were reviewed and updated as appropriate: allergies, current medications, past family history, past medical history, past social history, past surgical history and problem list.  Review of Systems Pertinent items noted in HPI and remainder of comprehensive ROS otherwise negative.    Objective:  BP 119/73 (BP Location: Right Arm, Patient Position: Sitting, Cuff  Size: Normal)   Pulse 92   Ht 5\' 2"  (1.575 m)   Wt 153 lb 6.4 oz (69.6 kg)   LMP 02/14/2020 (Exact Date)   Breastfeeding Yes   BMI 28.06 kg/m    General:  alert and no distress   Breasts:  inspection negative, no nipple discharge or bleeding, no masses or nodularity palpable  Lungs: clear to auscultation bilaterally  Heart:  regular rate and rhythm  Abdomen: soft, non-tender; bowel sounds normal; no masses,  no organomegaly. Incision is well-healed, no erythema, no induration , no drainage   Pelvic:  deferred by patient        Assessment:   Normal postpartum exam. Pap smear not done at today's visit as per patient's request, deferred until next month.   Plan:   Essential components of care per ACOG recommendations:  1.  Mood and well being: Patient with negative depression screening today. Reviewed local resources for support.  - Patient does not use tobacco. - hx of drug use? No   2. Infant care and feeding:  -Patient currently breastmilk feeding? Yes.  If breastmilk feeding discussed return to work and pumping.  Reviewed importance of draining breast regularly to support lactation. -Social determinants of health (SDOH) reviewed in EPIC. No concerns.  3. Sexuality, contraception and birth spacing - Patient does not want a pregnancy in the next year.  Desired family size is 4 children.  -  Reviewed forms of contraception in tiered fashion. Patient desired Depo-Provera . Received dose on 12/04/2020, prescribed this for her today. Will bring it with her next month for injection.   - Discussed birth spacing of 18 months  4. Sleep and fatigue -Encouraged family/partner/community support of 4 hrs of uninterrupted sleep to help with mood and fatigue  5. Physical Recovery  - Discussed patients delivery, no complications. - Patient has urinary incontinence? No  - Patient is safe to resume physical and sexual activity  6.  Health Maintenance - Last pap smear done 12/20/2017 and was  normal . Offered to do this today, patient reported she was "not mentally prepared" to do this today. Wants to do it next month while getting her Depo Provera injection.   Return in about 1 month (around 02/24/2021) for Pap smear and Depo provera injection (last one 12/04/2020).   Jaynie Collins, MD Center for [employer], [facility]

## 2021-02-23 ENCOUNTER — Other Ambulatory Visit: Payer: Self-pay

## 2021-02-23 DIAGNOSIS — Z3042 Encounter for surveillance of injectable contraceptive: Secondary | ICD-10-CM

## 2021-02-23 MED ORDER — MEDROXYPROGESTERONE ACETATE 150 MG/ML IM SUSP: 150.0000 mg | INTRAMUSCULAR | 3 refills | Status: DC

## 2021-02-24 ENCOUNTER — Ambulatory Visit (INDEPENDENT_AMBULATORY_CARE_PROVIDER_SITE_OTHER): Payer: Medicaid Other | Admitting: Obstetrics & Gynecology

## 2021-02-24 ENCOUNTER — Other Ambulatory Visit (HOSPITAL_COMMUNITY)
Admission: RE | Admit: 2021-02-24 | Discharge: 2021-02-24 | Disposition: A | Discharge status: Home / Self Care | Payer: Medicaid Other | Source: Ambulatory Visit | Attending: Obstetrics & Gynecology | Admitting: Obstetrics & Gynecology

## 2021-02-24 ENCOUNTER — Other Ambulatory Visit: Payer: Self-pay

## 2021-02-24 ENCOUNTER — Encounter: Payer: Self-pay | Admitting: Obstetrics & Gynecology

## 2021-02-24 VITALS — BP 118/83 | HR 79 | Ht 62.0 in | Wt 157.0 lb

## 2021-02-24 DIAGNOSIS — Z3042 Encounter for surveillance of injectable contraceptive: Secondary | ICD-10-CM

## 2021-02-24 DIAGNOSIS — Z01419 Encounter for gynecological examination (general) (routine) without abnormal findings: Secondary | ICD-10-CM | POA: Insufficient documentation

## 2021-02-24 MED ORDER — MEDROXYPROGESTERONE ACETATE 150 MG/ML IM SUSP: 150.0000 mg | INTRAMUSCULAR | 3 refills | Status: DC

## 2021-02-24 MED ORDER — MEDROXYPROGESTERONE ACETATE 150 MG/ML IM SUSP
150.0000 mg | Freq: Once | INTRAMUSCULAR | Status: AC
  Administered 2021-02-24: 150 mg via INTRAMUSCULAR

## 2021-02-24 NOTE — Progress Notes (Signed)
GYN presents for AEX/PAP/STD screening.  Office stock DEPO given in RD, tolerated well.  Next DEPO due Sept. 1-15, 2022  Administrations This Visit     medroxyPROGESTERone (DEPO-PROVERA) injection 150 mg     Admin Date 02/24/2021 Action Given Dose 150 mg Route Intramuscular Administered By Maretta Bees, RMA

## 2021-02-24 NOTE — Progress Notes (Signed)
Patient ID: Gina Montoya, female   DOB: 1991-09-10, 30 y.o.   MRN: [id number]  Chief Complaint  Patient presents with   Gynecologic Exam    HPI Gina Montoya is a 30 y.o. female.  [id number] Patient's last menstrual period was 02/17/2021 (exact date). She is 3 months postpartum and is doing well. She is due for pap and DMPA injection. HPI  Past Medical History:  Diagnosis Date   Anxiety    Depression    Dyspnea     Past Surgical History:  Procedure Laterality Date   CESAREAN SECTION N/A 08/28/2017   Procedure: CESAREAN SECTION;  Surgeon: Levie Heritage, DO;  [facility]: [facility];  Service: Obstetrics;  Laterality: N/A;   CESAREAN SECTION N/A 12/02/2020   Procedure: CESAREAN SECTION;  Surgeon: Myna Hidalgo, DO;  Location: [facility];  Service: Obstetrics;  Laterality: N/A;    Family History  Problem Relation Age of Onset   Depression Mother     Social History Social History   Tobacco Use   Smoking status: Never   Smokeless tobacco: Never  Vaping Use   Vaping Use: Never used  Substance Use Topics   Alcohol use: No   Drug use: No    No Known Allergies  Current Outpatient Medications  Medication Sig Dispense Refill   medroxyPROGESTERone (DEPO-PROVERA) 150 MG/ML injection Inject 1 mL (150 mg total) into the muscle every 3 (three) months. 1 mL 3   Current Facility-Administered Medications  Medication Dose Route Frequency Provider Last Rate Last Admin   medroxyPROGESTERone (DEPO-PROVERA) injection 150 mg  150 mg Intramuscular Once Adam Phenix, MD        Review of Systems Review of Systems  Constitutional: Negative.   Respiratory: Negative.    Cardiovascular: Negative.   Gastrointestinal: Negative.   Genitourinary: Negative.    Blood pressure 118/83, pulse 79, height 5\' 2"  (1.575 m), weight 157 lb (71.2 kg), last menstrual period 02/17/2021, currently breastfeeding.  Physical Exam Physical Exam Vitals and nursing note reviewed. Exam  conducted with a chaperone present.  Constitutional:      Appearance: Normal appearance.  HENT:     Head: Normocephalic and atraumatic.  Cardiovascular:     Rate and Rhythm: Normal rate.  Pulmonary:     Effort: Pulmonary effort is normal.  Abdominal:     General: Abdomen is flat.     Palpations: Abdomen is soft.  Genitourinary:    General: Normal vulva.     Exam position: Lithotomy position.     Vagina: Bleeding present.     Cervix: Normal.     Uterus: Normal.      Adnexa: Right adnexa normal and left adnexa normal.  Neurological:     Mental Status: She is alert.  Psychiatric:        Mood and Affect: Mood normal.        Behavior: Behavior normal.    Data Reviewed Normal pap 2019  Assessment Well woman exam with routine gynecological exam - Plan: Cytology - PAP( [place]), Cervicovaginal ancillary only( [place]), medroxyPROGESTERone (DEPO-PROVERA) injection 150 mg  On Depo-Provera for contraception   Plan RTC 3 months for DMPA    2020 02/24/2021, 11:02 AM

## 2021-02-25 LAB — CERVICOVAGINAL ANCILLARY ONLY
Chlamydia: NEGATIVE
Comment: NEGATIVE
Comment: NORMAL
Neisseria Gonorrhea: NEGATIVE

## 2021-02-28 LAB — CYTOLOGY - PAP: Diagnosis: NEGATIVE

## 2021-05-23 ENCOUNTER — Other Ambulatory Visit: Payer: Self-pay

## 2021-05-23 ENCOUNTER — Ambulatory Visit (INDEPENDENT_AMBULATORY_CARE_PROVIDER_SITE_OTHER): Payer: Medicaid Other

## 2021-05-23 VITALS — BP 110/72 | HR 79

## 2021-05-23 DIAGNOSIS — Z3042 Encounter for surveillance of injectable contraceptive: Secondary | ICD-10-CM | POA: Diagnosis not present

## 2021-05-23 MED ORDER — MEDROXYPROGESTERONE ACETATE 150 MG/ML IM SUSP
150.0000 mg | Freq: Once | INTRAMUSCULAR | Status: AC
  Administered 2021-05-23: 150 mg via INTRAMUSCULAR

## 2021-05-23 NOTE — Progress Notes (Signed)
Patient presented to the office today for depo-provera injection. Given by D.Cheree Ditto CMA  Route: RUOQ Medroxyprogesterone 150 mg HCG Serum: N/A NDC #81448-185-63 Side Effects: None

## 2021-08-11 ENCOUNTER — Ambulatory Visit: Payer: Medicaid Other

## 2021-08-11 ENCOUNTER — Other Ambulatory Visit: Payer: Self-pay

## 2021-08-11 ENCOUNTER — Ambulatory Visit
Admission: EM | Admit: 2021-08-11 | Discharge: 2021-08-11 | Disposition: A | Discharge status: Home / Self Care | Payer: Medicaid Other | Attending: Emergency Medicine | Admitting: Emergency Medicine

## 2021-08-11 DIAGNOSIS — M549 Dorsalgia, unspecified: Secondary | ICD-10-CM | POA: Diagnosis not present

## 2021-08-11 DIAGNOSIS — B029 Zoster without complications: Secondary | ICD-10-CM | POA: Diagnosis not present

## 2021-08-11 LAB — POCT URINE PREGNANCY: Preg Test, Ur: NEGATIVE

## 2021-08-11 MED ORDER — VALACYCLOVIR HCL 1 G PO TABS: 1000.0000 mg | ORAL_TABLET | Freq: Three times a day (TID) | ORAL | 0 refills | Status: AC

## 2021-08-11 NOTE — ED Triage Notes (Signed)
Pt reports back pain since Sunday, she reports she had singles as a child. Pt reports having a cough, chills and nausea.

## 2021-08-11 NOTE — ED Provider Notes (Signed)
[facility]    CSN: [id number] Arrival date & time: 08/11/21  1448    HISTORY  No chief complaint on file.  HPI Gina Montoya is a 30 y.o. female. Pt reports back pain since Sunday, she reports she had shingles as a child, states she does not recall which side it was on but knows that it was on her back at that time.  The history is provided by the patient.  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   Past Surgical History:  Procedure Laterality Date   CESAREAN SECTION N/A 08/28/2017   Procedure: CESAREAN SECTION;  Surgeon: Levie Heritage, DO;  [facility]: [facility];  Service: Obstetrics;  Laterality: N/A;   CESAREAN SECTION N/A 12/02/2020   Procedure: CESAREAN SECTION;  Surgeon: Myna Hidalgo, DO;  Location: [facility];  Service: Obstetrics;  Laterality: N/A;   OB History     Gravida  4   Para  3   Term  3   Preterm      AB  1   Living  3      SAB      IAB      Ectopic      Multiple  0   Live Births  3          Home Medications    Prior to Admission medications   Medication Sig Start Date End Date Taking? Authorizing Provider  valACYclovir (VALTREX) 1000 MG tablet Take 1 tablet (1,000 mg total) by mouth 3 (three) times daily for 7 days. 08/11/21 08/18/21 Yes Theadora Rama Scales, PA-C  medroxyPROGESTERone (DEPO-PROVERA) 150 MG/ML injection Inject 1 mL (150 mg total) into the muscle every 3 (three) months. 02/24/21   Brock Bad, MD   Family History Family History  Problem Relation Age of Onset   Depression Mother    Social History Social History   Tobacco Use   Smoking status: Never   Smokeless tobacco: Never  Vaping Use   Vaping Use: Never used  Substance Use Topics   Alcohol use: No    Drug use: No   Allergies   Patient has no known allergies.  Review of Systems Review of Systems Pertinent findings noted in history of present illness.   Physical Exam Triage Vital Signs ED Triage Vitals  Enc Vitals Group     BP 07/08/21 0827 (!) 147/82     Pulse Rate 07/08/21 0827 72     Resp 07/08/21 0827 18     Temp 07/08/21 0827 98.3 F (36.8 C)     Temp Source 07/08/21 0827 Oral     SpO2 07/08/21 0827 98 %     Weight --      Height --      Head Circumference --      Peak Flow --      Pain Score 07/08/21 0826 5     Pain Loc --      Pain Edu? --      Excl. in GC? --   No data found.  Updated Vital Signs BP 112/75 (BP Location: Left Arm)   Pulse 92   Temp 98.4 F (36.9 C) (Oral)   Resp 20   SpO2 98%   Breastfeeding Yes  Physical Exam Vitals and nursing note reviewed.  Constitutional:      General: She is not in acute distress.    Appearance: Normal appearance. She is not ill-appearing.  HENT:     Head: Normocephalic and atraumatic.  Eyes:     General: Lids are normal.        Right eye: No discharge.        Left eye: No discharge.     Extraocular Movements: Extraocular movements intact.     Conjunctiva/sclera: Conjunctivae normal.     Right eye: Right conjunctiva is not injected.     Left eye: Left conjunctiva is not injected.  Neck:     Trachea: Trachea and phonation normal.  Cardiovascular:     Rate and Rhythm: Normal rate and regular rhythm.     Pulses: Normal pulses.     Heart sounds: Normal heart sounds. No murmur heard.   No friction rub. No gallop.  Pulmonary:     Effort: Pulmonary effort is normal. No accessory muscle usage, prolonged expiration or respiratory distress.     Breath sounds: Normal breath sounds. No stridor, decreased air movement or transmitted upper airway sounds. No decreased breath sounds, wheezing, rhonchi or rales.  Chest:     Chest wall: No tenderness.  Musculoskeletal:        General: Normal range of motion.      Cervical back: Normal range of motion and neck supple. Normal range of motion.  Lymphadenopathy:     Cervical: No cervical adenopathy.  Skin:    General: Skin is warm and dry.     Findings: No erythema or rash.  Neurological:     General: No focal deficit present.     Mental Status: She is alert and oriented to person, place, and time.     Comments: Skin of back is exquisitely tender to palpation at T6 dermatome on the left, no rash appreciated  Psychiatric:        Mood and Affect: Mood normal.        Behavior: Behavior normal.    Visual Acuity Right Eye Distance:   Left Eye Distance:   Bilateral Distance:    Right Eye Near:   Left Eye Near:    Bilateral Near:     [facility] Couse / Diagnostics / Procedures:    EKG  Radiology No results found.  Procedures Procedures (including critical care time)  UC Diagnoses / Final Clinical Impressions(s)   I have reviewed the triage vital signs and the nursing notes.  Pertinent labs & imaging results that were available during my care of the patient were reviewed by me and considered in my medical decision making (see chart for details).    Final diagnoses:  Back pain, unspecified back location, unspecified back pain laterality, unspecified chronicity  Herpes zoster without complication   Low suspicion for herpes zoster infection but given patient's history and current symptoms along with a acute tenderness along T6 on the left, I believe it is appropriate to empirically treat her with valacyclovir 3 times a day for 7 days.  Pregnancy test is negative.  Patient advised.  Valacyclovir is generally considered safe for breast-feeding mothers.  Disposition Upon Discharge:  Condition: stable for discharge home Home: take medications as prescribed; routine discharge instructions as discussed; follow up as advised.  Patient presented with an acute illness with associated systemic symptoms and significant discomfort requiring urgent management.  In my opinion, this is a condition that a prudent lay person (someone  who possesses an average knowledge of health and medicine) may potentially expect to result in complications if not addressed urgently such as respiratory distress, impairment of bodily function or dysfunction of bodily organs.   Routine symptom specific, illness specific and/or disease specific instructions were discussed with the patient and/or caregiver at length.   As such, the patient has been evaluated and assessed, work-up was performed and treatment was provided in alignment with urgent care protocols and evidence based medicine.  Patient/parent/caregiver has been advised that the patient may require follow up for further testing and treatment if the symptoms continue in spite of treatment, as clinically indicated and appropriate.  If the patient was tested for COVID-19, Influenza and/or RSV, then the patient/parent/guardian was advised to isolate at home pending the results of his/her diagnostic coronavirus test and potentially longer if they're positive. I have also advised pt that if his/her COVID-19 test returns positive, it's recommended to self-isolate for at least 10 days after symptoms first appeared AND until fever-free for 24 hours without fever reducer AND other symptoms have improved or resolved. Discussed self-isolation recommendations as well as instructions for household member/close contacts as per the [facility] and [place] DHHS, and also gave patient the COVID packet with this information.  Patient/parent/caregiver has been advised to return to the [facility] or PCP in 3-5 days if no better; to PCP or the Emergency Department if new signs and symptoms develop, or if the current signs or symptoms continue to change or worsen for further workup, evaluation and treatment as clinically indicated and appropriate  The patient will follow up with their current PCP if and as advised. If the patient does not currently have a PCP we will  assist them in obtaining one.   The patient may need specialty follow up if the symptoms continue, in spite of conservative treatment and management, for further workup, evaluation, consultation and treatment as clinically indicated and appropriate.   Patient/parent/caregiver verbalized understanding and agreement of plan as discussed.  All questions were addressed during visit.  Please see discharge instructions below for further details of plan.  ED Prescriptions     Medication Sig Dispense Auth. Provider   valACYclovir (VALTREX) 1000 MG tablet Take 1 tablet (1,000 mg total) by mouth 3 (three) times daily for 7 days. 21 tablet Theadora Rama Scales, PA-C      PDMP not reviewed this encounter.  Pending results:  Labs Reviewed  POCT URINE PREGNANCY    Medications Ordered in UC: Medications - No data to display  Discharge Instructions:   Discharge Instructions      Baby's who are breast-fed by mother's who are currently taking Valacyclovir have been shown to have this medication in the urine and small amounts.  Because this medication is safe to give to babies for treatment of herpes zoster (shingles), valacyclovir is considered generally compatible with breast-feeding.           Theadora Rama Scales, [place] 08/11/21 [phone]

## 2021-08-11 NOTE — Discharge Instructions (Signed)
Baby's who are breast-fed by mother's who are currently taking Valacyclovir have been shown to have this medication in the urine and small amounts.  Because this medication is safe to give to babies for treatment of herpes zoster (shingles), valacyclovir is considered generally compatible with breast-feeding.

## 2021-08-16 ENCOUNTER — Ambulatory Visit: Payer: Medicaid Other

## 2021-08-22 ENCOUNTER — Ambulatory Visit: Payer: Medicaid Other

## 2021-08-29 ENCOUNTER — Other Ambulatory Visit: Payer: Self-pay

## 2021-08-29 ENCOUNTER — Ambulatory Visit (INDEPENDENT_AMBULATORY_CARE_PROVIDER_SITE_OTHER): Payer: Medicaid Other

## 2021-08-29 VITALS — BP 120/77 | HR 88 | Ht 62.0 in | Wt 164.0 lb

## 2021-08-29 DIAGNOSIS — Z3042 Encounter for surveillance of injectable contraceptive: Secondary | ICD-10-CM | POA: Diagnosis not present

## 2021-08-29 MED ORDER — MEDROXYPROGESTERONE ACETATE 150 MG/ML IM SUSP
150.0000 mg | Freq: Once | INTRAMUSCULAR | Status: AC
  Administered 2021-08-29: 16:00:00 150 mg via INTRAMUSCULAR

## 2021-08-29 NOTE — Progress Notes (Signed)
Depo Provera 150mg  given IM left deltoid per patient request. Pt tolerated well with no adverse side effects. Pt to return to return to clinic between 11/14/21-11/28/21 for repeat injection. Pt is up to date on yearly exam, due in June 2023.

## 2021-11-21 ENCOUNTER — Ambulatory Visit: Payer: Medicaid Other

## 2021-11-23 ENCOUNTER — Ambulatory Visit (INDEPENDENT_AMBULATORY_CARE_PROVIDER_SITE_OTHER): Payer: Medicaid Other | Admitting: Emergency Medicine

## 2021-11-23 ENCOUNTER — Other Ambulatory Visit: Payer: Self-pay

## 2021-11-23 VITALS — BP 110/74 | HR 83 | Ht 62.0 in | Wt 152.7 lb

## 2021-11-23 DIAGNOSIS — Z3042 Encounter for surveillance of injectable contraceptive: Secondary | ICD-10-CM | POA: Diagnosis not present

## 2021-11-23 MED ORDER — MEDROXYPROGESTERONE ACETATE 150 MG/ML IM SUSP
150.0000 mg | Freq: Once | INTRAMUSCULAR | Status: AC
  Administered 2022-02-14: 150 mg via INTRAMUSCULAR

## 2021-11-23 MED ORDER — MEDROXYPROGESTERONE ACETATE 150 MG/ML IM SUSP
150.0000 mg | Freq: Once | INTRAMUSCULAR | Status: AC
  Administered 2021-11-23: 150 mg via INTRAMUSCULAR

## 2021-11-23 NOTE — Addendum Note (Signed)
Addended by: Sharlyne Pacas on: 11/23/2021 12:02 PM ? ? Modules accepted: Orders ? ?

## 2021-11-23 NOTE — Progress Notes (Signed)
Date last pap: 02-24-2021. ?Last Depo-Provera: 08-29-2021. ?Side Effects if any: None. ?Serum HCG indicated? No. ?Depo-Provera 150 mg IM given by: J Taleyah Hillman, given Left upper, outer quadrant. ?Next appointment due May 31- Jun 14.  ?

## 2021-11-23 NOTE — Progress Notes (Signed)
Patient was assessed and managed by nursing staff during this encounter. I have reviewed the chart and agree with the documentation and plan. I have also made any necessary editorial changes. ? ?Griffin Basil, MD ?11/23/2021 1:20 PM   ?

## 2022-02-14 ENCOUNTER — Ambulatory Visit (INDEPENDENT_AMBULATORY_CARE_PROVIDER_SITE_OTHER): Payer: Medicaid Other

## 2022-02-14 DIAGNOSIS — Z3042 Encounter for surveillance of injectable contraceptive: Secondary | ICD-10-CM | POA: Diagnosis not present

## 2022-02-14 NOTE — Progress Notes (Signed)
Agree with nurses's documentation of this patient's clinic encounter.  Javi Bollman L, MD  

## 2022-02-14 NOTE — Progress Notes (Signed)
Pt is in the office for depo injection. Administered in RUOQ and pt tolerated well. Next due Aug 22-Sept 5 .. Administrations This Visit     medroxyPROGESTERone (DEPO-PROVERA) injection 150 mg     Admin Date 02/14/2022 Action Given Dose 150 mg Route Intramuscular Administered By Katrina Stack, RN

## 2022-05-01 ENCOUNTER — Other Ambulatory Visit: Payer: Self-pay | Admitting: Obstetrics

## 2022-05-01 DIAGNOSIS — Z3042 Encounter for surveillance of injectable contraceptive: Secondary | ICD-10-CM

## 2022-05-02 ENCOUNTER — Other Ambulatory Visit: Payer: Self-pay | Admitting: Emergency Medicine

## 2022-05-02 NOTE — Progress Notes (Signed)
Rx for depo

## 2022-05-08 ENCOUNTER — Ambulatory Visit: Payer: Medicaid Other

## 2022-05-09 ENCOUNTER — Ambulatory Visit (INDEPENDENT_AMBULATORY_CARE_PROVIDER_SITE_OTHER): Payer: Medicaid Other

## 2022-05-09 VITALS — BP 112/73 | HR 80 | Ht 62.0 in | Wt 154.0 lb

## 2022-05-09 DIAGNOSIS — Z3042 Encounter for surveillance of injectable contraceptive: Secondary | ICD-10-CM | POA: Diagnosis not present

## 2022-05-09 MED ORDER — MEDROXYPROGESTERONE ACETATE 150 MG/ML IM SUSP
150.0000 mg | Freq: Once | INTRAMUSCULAR | Status: AC
  Administered 2022-05-09: 150 mg via INTRAMUSCULAR

## 2022-05-09 NOTE — Progress Notes (Signed)
Patient was assessed and managed by nursing staff during this encounter. I have reviewed the chart and agree with the documentation and plan. I have also made any necessary editorial changes.  Warden Fillers, MD 05/09/2022 10:00 AM

## 2022-05-09 NOTE — Progress Notes (Addendum)
SUBJECTIVE: Gina Montoya is a 31 y.o. female who presents for DEPO Injection.   OBJECTIVE: Appears well, in no apparent distress.  Vital signs are normal.   ASSESSMENT: need for BC.  Last PAP 02/24/2021.  Needs Annual exam    PLAN: DEPO Injection given in LUOQ, tolerated well.   Next DEPO due Nov. 14-28, 2023  Make an appointment for your Annual Exam.   Administrations This Visit     medroxyPROGESTERone (DEPO-PROVERA) injection 150 mg     Admin Date 05/09/2022 Action Given Dose 150 mg Route Intramuscular Administered By Maretta Bees, RMA

## 2022-07-31 ENCOUNTER — Encounter: Payer: Self-pay | Admitting: Emergency Medicine

## 2022-07-31 ENCOUNTER — Other Ambulatory Visit: Payer: Self-pay | Admitting: Emergency Medicine

## 2022-07-31 DIAGNOSIS — Z3042 Encounter for surveillance of injectable contraceptive: Secondary | ICD-10-CM

## 2022-07-31 MED ORDER — MEDROXYPROGESTERONE ACETATE 150 MG/ML IM SUSP: 150.0000 mg | INTRAMUSCULAR | 0 refills | Status: DC

## 2022-07-31 NOTE — Progress Notes (Signed)
Rx for Depo, 1x dose.

## 2022-08-02 ENCOUNTER — Ambulatory Visit (INDEPENDENT_AMBULATORY_CARE_PROVIDER_SITE_OTHER): Payer: Medicaid Other | Admitting: Obstetrics

## 2022-08-02 ENCOUNTER — Encounter: Payer: Self-pay | Admitting: Obstetrics

## 2022-08-02 VITALS — BP 111/76 | HR 87 | Ht 62.0 in | Wt 151.2 lb

## 2022-08-02 DIAGNOSIS — F17200 Nicotine dependence, unspecified, uncomplicated: Secondary | ICD-10-CM | POA: Diagnosis not present

## 2022-08-02 DIAGNOSIS — Z01419 Encounter for gynecological examination (general) (routine) without abnormal findings: Secondary | ICD-10-CM | POA: Diagnosis not present

## 2022-08-02 DIAGNOSIS — Z3042 Encounter for surveillance of injectable contraceptive: Secondary | ICD-10-CM

## 2022-08-02 MED ORDER — MEDROXYPROGESTERONE ACETATE 150 MG/ML IM SUSP: 150.0000 mg | INTRAMUSCULAR | 3 refills | Status: DC

## 2022-08-02 MED ORDER — MEDROXYPROGESTERONE ACETATE 150 MG/ML IM SUSP
150.0000 mg | Freq: Once | INTRAMUSCULAR | Status: AC
  Administered 2022-08-02: 150 mg via INTRAMUSCULAR

## 2022-08-02 NOTE — Progress Notes (Signed)
Subjective:        Gina Montoya is a 31 y.o. female here for a routine exam.  Current complaints: None.    Personal health questionnaire:  Is patient Ashkenazi Jewish, have a family history of breast and/or ovarian cancer: no Is there a family history of uterine cancer diagnosed at age < 60, gastrointestinal cancer, urinary tract cancer, family member who is a Field seismologist syndrome-associated carrier: no Is the patient overweight and hypertensive, family history of diabetes, personal history of gestational diabetes, preeclampsia or PCOS: no Is patient over 48, have PCOS,  family history of premature CHD under age 10, diabetes, smoke, have hypertension or peripheral artery disease:  no At any time, has a partner hit, kicked or otherwise hurt or frightened you?: no Over the past 2 weeks, have you felt down, depressed or hopeless?: no Over the past 2 weeks, have you felt little interest or pleasure in doing things?:no   Gynecologic History No LMP recorded. Patient has had an injection. Contraception: Depo-Provera injections Last Pap: 2022. Results were: normal Last mammogram: n/a. Results were: n/a  Obstetric History OB History  Gravida Para Term Preterm AB Living  4 3 3   1 3   SAB IAB Ectopic Multiple Live Births        0 3    # Outcome Date GA Lbr Len/2nd Weight Sex Delivery Anes PTL Lv  4 Term 12/02/20 [redacted]w[redacted]d  6 lb 3.1 oz (2.81 kg) M CS-LTranv EPI  LIV  3 Term 08/28/17 [redacted]w[redacted]d  5 lb 0.6 oz (2.285 kg) M CS-LTranv EPI  LIV  2 Term 02/02/16 [redacted]w[redacted]d  6 lb (2.722 kg) F Vag-Spont EPI N LIV  1 AB             Past Medical History:  Diagnosis Date   Anxiety    Depression    Dyspnea     Past Surgical History:  Procedure Laterality Date   CESAREAN SECTION N/A 08/28/2017   Procedure: CESAREAN SECTION;  Surgeon: Truett Mainland, DO;  Location: [place];  Service: Obstetrics;  Laterality: N/A;   CESAREAN SECTION N/A 12/02/2020   Procedure: CESAREAN SECTION;  Surgeon: Janyth Pupa, DO;  Location: [facility];  Service: Obstetrics;  Laterality: N/A;     Current Outpatient Medications:    medroxyPROGESTERone (DEPO-PROVERA) 150 MG/ML injection, Inject 1 mL (150 mg total) into the muscle every 3 (three) months., Disp: 1 mL, Rfl: 0   medroxyPROGESTERone (DEPO-PROVERA) 150 MG/ML injection, Inject 1 mL (150 mg total) into the muscle every 3 (three) months., Disp: 1 mL, Rfl: 3  Current Facility-Administered Medications:    medroxyPROGESTERone (DEPO-PROVERA) injection 150 mg, 150 mg, Intramuscular, Once, Shelly Bombard, MD No Known Allergies  Social History   Tobacco Use   Smoking status: Every Day    Types: Cigarettes   Smokeless tobacco: Never   Tobacco comments:    Black and milds   Substance Use Topics   Alcohol use: No    Family History  Problem Relation Age of Onset   Depression Mother    Dementia Mother       Review of Systems  Constitutional: negative for fatigue and weight loss Respiratory: negative for cough and wheezing Cardiovascular: negative for chest pain, fatigue and palpitations Gastrointestinal: negative for abdominal pain and change in bowel habits Musculoskeletal:negative for myalgias Neurological: negative for gait problems and tremors Behavioral/Psych: negative for abusive relationship, depression Endocrine: negative for temperature intolerance    Genitourinary: positive for vaginal discharge.  negative for abnormal menstrual periods, genital lesions, hot flashes, sexual problems  Integument/breast: negative for breast lump, breast tenderness, nipple discharge and skin lesion(s)    Objective:       BP 111/76   Pulse 87   Ht 5\' 2"  (1.575 m)   Wt 151 lb 3.2 oz (68.6 kg)   BMI 27.65 kg/m  General:   Alert and no distress  Skin:   no rash or abnormalities  Lungs:   clear to auscultation bilaterally  Heart:   regular rate and rhythm, S1, S2 normal, no murmur, click, rub or gallop  Breasts:   normal without suspicious  masses, skin or nipple changes or axillary nodes  Abdomen:  normal findings: no organomegaly, soft, non-tender and no hernia  Pelvis:  External genitalia: normal general appearance Urinary system: urethral meatus normal and bladder without fullness, nontender Vaginal: normal without tenderness, induration or masses Cervix: normal appearance Adnexa: normal bimanual exam Uterus: anteverted and non-tender, normal size   Lab Review Urine pregnancy test Labs reviewed yes Radiologic studies reviewed no  I have spent a total of 20 minutes of face-to-face time, excluding clinical staff time, reviewing notes and preparing to see patient, ordering tests and/or medications, and counseling the patient.   Assessment:    1. Encounter for annual routine gynecological examination  2. Encounter for Depo-Provera contraception Rx: - medroxyPROGESTERone (DEPO-PROVERA) 150 MG/ML injection; Inject 1 mL (150 mg total) into the muscle every 3 (three) months.  Dispense: 1 mL; Refill: 3 - medroxyPROGESTERone (DEPO-PROVERA) injection 150 mg  3. Tobacco dependence - cessation recommended         Education reviewed: calcium supplements, depression evaluation, low fat, low cholesterol diet, safe sex/STD prevention, self breast exams, smoking cessation, and weight bearing exercise. Contraception: Depo-Provera injections. Follow up in: 3 months.   Meds ordered this encounter  Medications   medroxyPROGESTERone (DEPO-PROVERA) 150 MG/ML injection    Sig: Inject 1 mL (150 mg total) into the muscle every 3 (three) months.    Dispense:  1 mL    Refill:  3   medroxyPROGESTERone (DEPO-PROVERA) injection 150 mg      , MD 08/02/2022 1:26 PM

## 2022-08-02 NOTE — Progress Notes (Signed)
Pt presents for AEX and depo refill. Pt declines PAP and STD testing today. No concerns at this time.    Date last pap: 02-24-21. Last Depo-Provera: 05-09-22. Side Effects if any: Pt tolerated well. Serum HCG indicated? Depo given on schedule. Depo-Provera 150 mg IM given by: Hope Pigeon, CMA in the RUOQ per pt request. Next appointment due 2/7-2/21.

## 2022-10-26 ENCOUNTER — Ambulatory Visit (INDEPENDENT_AMBULATORY_CARE_PROVIDER_SITE_OTHER): Payer: Medicaid Other | Admitting: Emergency Medicine

## 2022-10-26 VITALS — BP 123/84 | HR 93 | Ht 62.0 in | Wt 147.6 lb

## 2022-10-26 DIAGNOSIS — Z3042 Encounter for surveillance of injectable contraceptive: Secondary | ICD-10-CM | POA: Diagnosis not present

## 2022-10-26 MED ORDER — MEDROXYPROGESTERONE ACETATE 150 MG/ML IM SUSP
150.0000 mg | Freq: Once | INTRAMUSCULAR | Status: AC
  Administered 2022-10-26: 150 mg via INTRAMUSCULAR

## 2022-10-26 NOTE — Progress Notes (Signed)
Date last pap: 02/24/21. Last Depo-Provera: 08/02/22. Side Effects if any: cramping, increased appetite, headaches, spotting. Serum HCG indicated? NA. Depo-Provera 150 mg IM given by: Corinna Lines, RN into LUOQ, tolerated well. Next appointment due May 13-May 17.

## 2023-01-18 ENCOUNTER — Ambulatory Visit (INDEPENDENT_AMBULATORY_CARE_PROVIDER_SITE_OTHER): Payer: Medicaid Other

## 2023-01-18 DIAGNOSIS — Z3042 Encounter for surveillance of injectable contraceptive: Secondary | ICD-10-CM

## 2023-01-18 MED ORDER — MEDROXYPROGESTERONE ACETATE 150 MG/ML IM SUSY
150.0000 mg | PREFILLED_SYRINGE | Freq: Once | INTRAMUSCULAR | Status: AC
  Administered 2023-01-18: 150 mg via INTRAMUSCULAR

## 2023-01-18 NOTE — Progress Notes (Signed)
Date last pap: 02/24/2021. Last Depo-Provera: 10/26/22. Side Effects if any: N/A. Serum HCG indicated? N/A. Depo-Provera 150 mg IM given by: Cristabel Bicknell B., Injection given in RUOQ. Patient tolerated well. Next appointment due July 25-Aug 8.

## 2023-04-13 ENCOUNTER — Ambulatory Visit: Payer: Medicaid Other | Admitting: General Practice

## 2023-04-13 VITALS — BP 118/83 | HR 84 | Ht 62.0 in | Wt 132.8 lb

## 2023-04-13 DIAGNOSIS — Z3042 Encounter for surveillance of injectable contraceptive: Secondary | ICD-10-CM

## 2023-04-13 MED ORDER — MEDROXYPROGESTERONE ACETATE 150 MG/ML IM SUSP
150.0000 mg | INTRAMUSCULAR | Status: AC
  Administered 2023-04-13: 150 mg via INTRAMUSCULAR

## 2023-04-13 NOTE — Progress Notes (Signed)
Date last pap: 02-24-21. Last Depo-Provera: 01-18-23. Side Effects if any: Pt tolerated well. Serum HCG indicated? N/A; Depo given on schedule Depo-Provera 150 mg IM given by: Hope Pigeon, CMA in the LD per pt request. Next appointment due 10/18-11/1.

## 2023-07-06 ENCOUNTER — Ambulatory Visit (INDEPENDENT_AMBULATORY_CARE_PROVIDER_SITE_OTHER): Payer: Medicaid Other | Admitting: Emergency Medicine

## 2023-07-06 VITALS — BP 115/74 | HR 102 | Wt 133.8 lb

## 2023-07-06 DIAGNOSIS — Z3042 Encounter for surveillance of injectable contraceptive: Secondary | ICD-10-CM | POA: Diagnosis not present

## 2023-07-06 MED ORDER — MEDROXYPROGESTERONE ACETATE 150 MG/ML IM SUSP
150.0000 mg | Freq: Once | INTRAMUSCULAR | Status: AC
  Administered 2023-07-06: 150 mg via INTRAMUSCULAR

## 2023-07-06 NOTE — Progress Notes (Signed)
Date last pap: 02/24/2021. Last Depo-Provera: 04/13/23. Side Effects if any: NA. Serum HCG indicated? NA. Depo-Provera 150 mg IM given by: Resa Miner, RN into RUOQ. Next appointment due Jan 10-Jan 24.

## 2023-09-25 ENCOUNTER — Telehealth: Payer: Self-pay | Admitting: Obstetrics

## 2023-09-25 ENCOUNTER — Ambulatory Visit: Payer: Medicaid Other | Admitting: Emergency Medicine

## 2023-09-25 ENCOUNTER — Other Ambulatory Visit: Payer: Self-pay

## 2023-09-25 VITALS — BP 113/80 | HR 94 | Wt 133.4 lb

## 2023-09-25 DIAGNOSIS — Z3042 Encounter for surveillance of injectable contraceptive: Secondary | ICD-10-CM | POA: Diagnosis not present

## 2023-09-25 MED ORDER — MEDROXYPROGESTERONE ACETATE 150 MG/ML IM SUSP: 150.0000 mg | INTRAMUSCULAR | 0 refills | Status: DC

## 2023-09-25 MED ORDER — MEDROXYPROGESTERONE ACETATE 150 MG/ML IM SUSP
150.0000 mg | Freq: Once | INTRAMUSCULAR | Status: AC
  Administered 2023-09-25: 150 mg via INTRAMUSCULAR

## 2023-09-25 NOTE — Telephone Encounter (Signed)
 Error

## 2023-09-25 NOTE — Progress Notes (Signed)
 Refill for depo sent in but pt made aware she needs to schedule AEX before anymore refills.

## 2023-09-25 NOTE — Progress Notes (Signed)
Date last pap: 02/24/2021. Last Depo-Provera: 07/06/2023 Side Effects if any: NA. Serum HCG indicated? NA. Depo-Provera 150 mg IM given by: Resa Miner, RN into LUOQ, tolerated well. Next appointment due Apr 1-15.

## 2023-09-28 ENCOUNTER — Ambulatory Visit: Payer: Medicaid Other

## 2023-10-15 ENCOUNTER — Other Ambulatory Visit: Payer: Self-pay

## 2023-10-15 DIAGNOSIS — Z3042 Encounter for surveillance of injectable contraceptive: Secondary | ICD-10-CM

## 2023-10-15 MED ORDER — MEDROXYPROGESTERONE ACETATE 150 MG/ML IM SUSP: 150.0000 mg | INTRAMUSCULAR | 0 refills | Status: DC

## 2023-11-06 ENCOUNTER — Ambulatory Visit: Payer: Medicaid Other | Admitting: Advanced Practice Midwife

## 2024-02-12 ENCOUNTER — Other Ambulatory Visit: Payer: Self-pay

## 2024-02-12 DIAGNOSIS — Z3042 Encounter for surveillance of injectable contraceptive: Secondary | ICD-10-CM

## 2024-02-12 MED ORDER — MEDROXYPROGESTERONE ACETATE 150 MG/ML IM SUSP: 150.0000 mg | INTRAMUSCULAR | 0 refills | Status: DC

## 2024-02-12 NOTE — Progress Notes (Signed)
 Pt has AEX scheduled. One refill sent for [facility].

## 2024-03-03 ENCOUNTER — Other Ambulatory Visit (HOSPITAL_COMMUNITY)
Admission: RE | Admit: 2024-03-03 | Discharge: 2024-03-03 | Disposition: A | Discharge status: Home / Self Care | Source: Ambulatory Visit | Attending: Advanced Practice Midwife | Admitting: Advanced Practice Midwife

## 2024-03-03 ENCOUNTER — Encounter: Payer: Self-pay | Admitting: Advanced Practice Midwife

## 2024-03-03 ENCOUNTER — Ambulatory Visit: Admitting: Advanced Practice Midwife

## 2024-03-03 VITALS — BP 100/67 | HR 73 | Ht 62.0 in | Wt 151.0 lb

## 2024-03-03 DIAGNOSIS — E049 Nontoxic goiter, unspecified: Secondary | ICD-10-CM

## 2024-03-03 DIAGNOSIS — Z01419 Encounter for gynecological examination (general) (routine) without abnormal findings: Secondary | ICD-10-CM | POA: Diagnosis not present

## 2024-03-03 DIAGNOSIS — Z30013 Encounter for initial prescription of injectable contraceptive: Secondary | ICD-10-CM

## 2024-03-03 DIAGNOSIS — Z124 Encounter for screening for malignant neoplasm of cervix: Secondary | ICD-10-CM | POA: Diagnosis not present

## 2024-03-03 MED ORDER — MEDROXYPROGESTERONE ACETATE 150 MG/ML IM SUSP: 150.0000 mg | INTRAMUSCULAR | 3 refills | Status: AC

## 2024-03-03 MED ORDER — MEDROXYPROGESTERONE ACETATE 150 MG/ML IM SUSP
150.0000 mg | Freq: Once | INTRAMUSCULAR | Status: AC
  Administered 2024-03-03: 150 mg via INTRAMUSCULAR

## 2024-03-03 NOTE — Progress Notes (Signed)
 Pt presents for AEX  Last PAP 02/2021 Declines STD testing  Wants to restart Depo. Last injection 09-25-2023. Last unprotected sex last night.

## 2024-03-03 NOTE — Progress Notes (Signed)
 Subjective:     Gina Montoya is a 33 y.o. female here at [facility] for a routine exam.  Current complaints: none.  Personal and family health history reviewed: yes.  Do you have a primary care provider? No--list provided  Do you feel safe at home? Yes   Flowsheet Row Office Visit from 08/02/2022 in [facility] Total Score 0    Health Maintenance Due  Topic Date Due   Pneumococcal Vaccine 25-31 Years old (1 of 2 - PCV) Never done   HPV VACCINES (1 - 3-dose SCDM series) Never done   COVID-19 Vaccine (1 - 2024-25 season) Never done   Cervical Cancer Screening (HPV/Pap Cotest)  02/25/2024     Risk factors for chronic health problems: Smoking: Alchohol/how much: Pt BMI: Body mass index is 27.62 kg/m.   Gynecologic History No LMP recorded. Patient has had an injection. Contraception: Depo-Provera  injections Last Pap: 2022. Results were: normal Last mammogram: n/a.   Obstetric History OB History  Gravida Para Term Preterm AB Living  4 3 3  1 3   SAB IAB Ectopic Multiple Live Births     0 3    # Outcome Date GA Lbr Len/2nd Weight Sex Type Anes PTL Lv  4 Term 12/02/20 [redacted]w[redacted]d  6 lb 3.1 oz (2.81 kg) M CS-LTranv EPI  LIV  3 Term 08/28/17 [redacted]w[redacted]d  5 lb 0.6 oz (2.285 kg) M CS-LTranv EPI  LIV  2 Term 02/02/16 [redacted]w[redacted]d  6 lb (2.722 kg) F Vag-Spont EPI N LIV  1 AB              The following portions of the patient's history were reviewed and updated as appropriate: allergies, current medications, past family history, past medical history, past social history, past surgical history, and problem list.  Review of Systems Pertinent items noted in HPI and remainder of comprehensive ROS otherwise negative.    Objective:   Today's Vitals   03/03/24 1024  BP: 100/67  Pulse: 73  Weight: 151 lb (68.5 kg)  Height: 5' 2 (1.575 m)   Body mass index is 27.62 kg/m.  VS reviewed, nursing note reviewed,  Constitutional: well developed,  well nourished, no distress HEENT: normocephalic, thyroid without enlargement or mass HEART: RRR, no murmurs rubs/gallops RESP: clear and equal to auscultation bilaterally in all lobes  Breast Exam:  exam performed: right breast normal without mass, skin or nipple changes or axillary nodes, left breast normal without mass, skin or nipple changes or axillary nodes Abdomen: soft Neuro: alert and oriented x 3 Skin: warm, dry Psych: affect normal Pelvic exam: Performed: Cervix pink, visually closed, without lesion, scant white creamy discharge, vaginal walls and external genitalia normal Bimanual exam: Cervix 0/long/high, firm, anterior, neg CMT, uterus nontender, nonenlarged, adnexa without tenderness, enlargement, or mass        Assessment/Plan:   1. Encounter for annual routine gynecological examination   2. Enlarged thyroid (Primary) --Pt reports some recent weight loss and gain since starting Depo, no other concerns. --Thyroid symmetric but slightly enlarged bilaterally, no palpable masses.  --Outpatient thyroid US  and labs, as pt does not have PCP --Recommend PCP, list sent to pt  - TSH - US  THYROID; Future   4. Initiation of Depo Provera  --Pt missed last dose but wants to resume. Recent unprotected intercourse, with neg UPT today. Low risk to initiation, so Depo given today. Pt to take home UPT in 2 weeks.  Return in about 1 year (around 03/03/2025) for annual exam.   Olam Boards, CNM 12:05 PM

## 2024-03-04 LAB — TSH: TSH: 0.531 u[IU]/mL (ref 0.450–4.500)

## 2024-03-06 LAB — CYTOLOGY - PAP
Comment: NEGATIVE
Diagnosis: NEGATIVE
Diagnosis: REACTIVE
High risk HPV: NEGATIVE

## 2024-03-10 ENCOUNTER — Other Ambulatory Visit

## 2024-04-12 ENCOUNTER — Ambulatory Visit
Admission: EM | Admit: 2024-04-12 | Discharge: 2024-04-12 | Disposition: A | Discharge status: Home / Self Care | Attending: Family Medicine | Admitting: Family Medicine

## 2024-04-12 DIAGNOSIS — H209 Unspecified iridocyclitis: Secondary | ICD-10-CM

## 2024-04-12 DIAGNOSIS — H5711 Ocular pain, right eye: Secondary | ICD-10-CM | POA: Diagnosis not present

## 2024-04-12 MED ORDER — PREDNISOLONE ACETATE 1 % OP SUSP: 1.0000 [drp] | Freq: Four times a day (QID) | OPHTHALMIC | 0 refills | Status: DC
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
## 2024-04-12 NOTE — Discharge Instructions (Signed)
 I discussed your case with Dr. Charmayne. The recommendation is to start a steroid for an inflammatory process from your eye injury.  This is called traumatic iritis.  Make sure that you use this drops 4 times daily.  Before each use shake the bottle thoroughly before administering the drops.  Follow-up with Dr. Ubaldo office on Monday morning.
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
## 2024-06-17 ENCOUNTER — Ambulatory Visit (INDEPENDENT_AMBULATORY_CARE_PROVIDER_SITE_OTHER)

## 2024-06-17 VITALS — BP 102/68 | HR 90

## 2024-06-17 DIAGNOSIS — Z3042 Encounter for surveillance of injectable contraceptive: Secondary | ICD-10-CM

## 2024-06-17 DIAGNOSIS — Z3202 Encounter for pregnancy test, result negative: Secondary | ICD-10-CM | POA: Diagnosis not present

## 2024-06-17 LAB — POCT URINE PREGNANCY: Preg Test, Ur: NEGATIVE

## 2024-06-17 MED ORDER — MEDROXYPROGESTERONE ACETATE 150 MG/ML IM SUSP
150.0000 mg | Freq: Once | INTRAMUSCULAR | Status: AC
  Administered 2024-06-17: 150 mg via INTRAMUSCULAR

## 2024-06-17 NOTE — Progress Notes (Signed)
 Date last pap: 03/03/24. Last Depo-Provera : 03/03/24. Side Effects if any: NA. Serum HCG indicated? NA. Depo-Provera  150 mg IM given by: Duwaine Galla, RN. Next appointment due Dec 23rd-Jan 6th.

## 2024-07-25 ENCOUNTER — Ambulatory Visit
Admission: EM | Admit: 2024-07-25 | Discharge: 2024-07-25 | Disposition: A | Discharge status: Home / Self Care | Attending: Family Medicine | Admitting: Family Medicine

## 2024-07-25 DIAGNOSIS — J069 Acute upper respiratory infection, unspecified: Secondary | ICD-10-CM | POA: Diagnosis not present

## 2024-07-25 DIAGNOSIS — R051 Acute cough: Secondary | ICD-10-CM | POA: Diagnosis not present

## 2024-07-25 LAB — POCT INFLUENZA A/B
Influenza A, POC: NEGATIVE
Influenza B, POC: NEGATIVE

## 2024-07-25 NOTE — ED Triage Notes (Signed)
 Pt present with c/o cough, fever x 4 days. Pt states she has lower back pain and body aches. Pt states she wants Flu testing.  Home interventions: Dayquil, Tylenol  extra strength

## 2024-07-25 NOTE — Discharge Instructions (Signed)
 You were seen today for upper respiratory symptoms.  Your flu swab was negative today.  At this time your symptoms appear viral.  You may continue over the counter medications for symptoms, including tylenol /motrin  for body aches and fevers.  Please get plenty of rest and fluids.   Return if you are not improving or worsening.

## 2024-07-25 NOTE — ED Provider Notes (Signed)
 [facility]    CSN: [id number] Arrival date & time: 07/25/24  1008      History   Chief Complaint Chief Complaint  Patient presents with   Cough   Chills   Nausea    HPI Gina Montoya is a 33 y.o. female.    Cough Associated symptoms: chills, fever and shortness of breath    Patient is here for 4 days of cough, fever and nausea.  She has not checked her own temp, but has had chills/hot sweats.  Some sob the last several day.  +body aches.  She has used otc meds with some help.  Her daughter was sick from something at school.        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    Past Surgical History:  Procedure Laterality Date   CESAREAN SECTION N/A 08/28/2017   Procedure: CESAREAN SECTION;  Surgeon: Barbra Lang PARAS, DO;  Location: [facility];  Service: Obstetrics;  Laterality: N/A;   CESAREAN SECTION N/A 12/02/2020   Procedure: CESAREAN SECTION;  Surgeon: Ozan, Jennifer, DO;  Location: [facility];  Service: Obstetrics;  Laterality: N/A;   WISDOM TOOTH EXTRACTION      OB History     Gravida  4   Para  3   Term  3   Preterm      AB  1   Living  3      SAB      IAB      Ectopic      Multiple  0   Live Births  3            Home Medications    Prior to Admission medications   Medication Sig Start Date End Date Taking? Authorizing Provider  medroxyPROGESTERone  (DEPO-PROVERA ) 150 MG/ML injection Inject 1 mL (150 mg total) into the muscle every 3 (three) months. 03/03/24   Leftwich-Kirby, Olam LABOR, CNM  prednisoLONE  acetate (PRED FORTE ) 1 % ophthalmic suspension Place 1 drop into the right eye 4 (four) times daily. 04/12/24   Christopher Savannah, PA-C    Family History Family History  Problem  Relation Age of Onset   Depression Mother    Dementia Mother     Social History Social History   Tobacco Use   Smoking status: Every Day    Types: Cigarettes, Cigars   Smokeless tobacco: Never   Tobacco comments:    Black and milds   Vaping Use   Vaping status: Never Used  Substance Use Topics   Alcohol use: No   Drug use: No     Allergies   Patient has no known allergies.   Review of Systems Review of Systems  Constitutional:  Positive for chills, fatigue and fever.  HENT:  Positive for congestion.   Respiratory:  Positive for cough and shortness of breath.   Cardiovascular: Negative.   Gastrointestinal: Negative.   Genitourinary: Negative.   Musculoskeletal: Negative.   Psychiatric/Behavioral: Negative.       Physical Exam Triage Vital Signs ED Triage Vitals  Encounter Vitals Group     BP 07/25/24 1022 104/74     Girls Systolic BP Percentile --      Girls Diastolic BP Percentile --  Boys Systolic BP Percentile --      Boys Diastolic BP Percentile --      Pulse Rate 07/25/24 1022 68     Resp 07/25/24 1022 18     Temp 07/25/24 1022 98.1 F (36.7 C)     Temp Source 07/25/24 1022 Oral     SpO2 07/25/24 1022 98 %     Weight --      Height --      Head Circumference --      Peak Flow --      Pain Score 07/25/24 1021 4     Pain Loc --      Pain Education --      Exclude from Growth Chart --    No data found.  Updated Vital Signs BP 104/74 (BP Location: Right Arm)   Pulse 68   Temp 98.1 F (36.7 C) (Oral)   Resp 18   LMP  (LMP Unknown) Comment: Pt states she typically does not have periods due to dep shot.  SpO2 98%   Visual Acuity Right Eye Distance:   Left Eye Distance:   Bilateral Distance:    Right Eye Near:   Left Eye Near:    Bilateral Near:     Physical Exam Constitutional:      General: She is not in acute distress.    Appearance: Normal appearance. She is normal weight. She is not ill-appearing or toxic-appearing.  HENT:      Nose: Nose normal.  Cardiovascular:     Rate and Rhythm: Normal rate and regular rhythm.  Pulmonary:     Effort: Pulmonary effort is normal.     Breath sounds: Normal breath sounds. No wheezing or rhonchi.  Musculoskeletal:     Cervical back: Normal range of motion and neck supple. Tenderness present.  Lymphadenopathy:     Cervical: No cervical adenopathy.  Skin:    General: Skin is warm.  Neurological:     General: No focal deficit present.     Mental Status: She is alert.  Psychiatric:        Mood and Affect: Mood normal.      UC Treatments / Results  Labs (all labs ordered are listed, but only abnormal results are displayed) Labs Reviewed  POCT INFLUENZA A/B    EKG   Radiology No results found.  Procedures Procedures (including critical care time)  Medications Ordered in UC Medications - No data to display  Initial Impression / Assessment and Plan / UC Course  I have reviewed the triage vital signs and the nursing notes.  Pertinent labs & imaging results that were available during my care of the patient were reviewed by me and considered in my medical decision making (see chart for details).  Final Clinical Impressions(s) / UC Diagnoses   Final diagnoses:  Acute cough  Viral upper respiratory infection     Discharge Instructions      You were seen today for upper respiratory symptoms.  Your flu swab was negative today.  At this time your symptoms appear viral.  You may continue over the counter medications for symptoms, including tylenol /motrin  for body aches and fevers.  Please get plenty of rest and fluids.   Return if you are not improving or worsening.     ED Prescriptions   None    PDMP not reviewed this encounter.   Darral Longs, MD 07/25/24 1047

## 2024-09-15 ENCOUNTER — Ambulatory Visit
